# Patient Record
Sex: Female | Born: 1960 | Race: White | Hispanic: No | Marital: Married | Smoking: Former smoker
Health system: Southern US, Community
[De-identification: ages and names within clinical notes are randomized; demographics above are authoritative.]

## PROBLEM LIST (undated history)

## (undated) DIAGNOSIS — T8859XA Other complications of anesthesia, initial encounter: Secondary | ICD-10-CM

## (undated) DIAGNOSIS — T4145XA Adverse effect of unspecified anesthetic, initial encounter: Secondary | ICD-10-CM

## (undated) DIAGNOSIS — M1990 Unspecified osteoarthritis, unspecified site: Secondary | ICD-10-CM

## (undated) DIAGNOSIS — E7800 Pure hypercholesterolemia, unspecified: Secondary | ICD-10-CM

## (undated) DIAGNOSIS — E039 Hypothyroidism, unspecified: Secondary | ICD-10-CM

## (undated) DIAGNOSIS — R9431 Abnormal electrocardiogram [ECG] [EKG]: Secondary | ICD-10-CM

## (undated) DIAGNOSIS — E079 Disorder of thyroid, unspecified: Secondary | ICD-10-CM

## (undated) DIAGNOSIS — Z972 Presence of dental prosthetic device (complete) (partial): Secondary | ICD-10-CM

## (undated) DIAGNOSIS — K219 Gastro-esophageal reflux disease without esophagitis: Secondary | ICD-10-CM

## (undated) DIAGNOSIS — C801 Malignant (primary) neoplasm, unspecified: Secondary | ICD-10-CM

## (undated) HISTORY — PX: BACK SURGERY: SHX140

---
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
## 2004-02-15 ENCOUNTER — Encounter: Payer: Self-pay | Admitting: Unknown Physician Specialty

## 2004-03-17 ENCOUNTER — Encounter: Payer: Self-pay | Admitting: Unknown Physician Specialty

## 2004-04-06 ENCOUNTER — Ambulatory Visit: Payer: Self-pay | Admitting: Otolaryngology

## 2004-04-14 ENCOUNTER — Ambulatory Visit: Payer: Self-pay | Admitting: Unknown Physician Specialty

## 2004-07-22 ENCOUNTER — Emergency Department: Payer: Self-pay | Admitting: Internal Medicine

## 2004-09-28 ENCOUNTER — Encounter: Payer: Self-pay | Admitting: Orthopedic Surgery

## 2005-02-11 ENCOUNTER — Ambulatory Visit: Payer: Self-pay | Admitting: Otolaryngology

## 2005-03-10 ENCOUNTER — Ambulatory Visit: Payer: Self-pay | Admitting: Otolaryngology

## 2005-05-11 ENCOUNTER — Ambulatory Visit: Payer: Self-pay | Admitting: Otolaryngology

## 2005-05-15 ENCOUNTER — Other Ambulatory Visit: Payer: Self-pay

## 2005-05-15 ENCOUNTER — Inpatient Hospital Stay: Payer: Self-pay | Admitting: Unknown Physician Specialty

## 2005-11-04 ENCOUNTER — Encounter: Admission: RE | Admit: 2005-11-04 | Discharge: 2005-11-04 | Payer: Self-pay | Admitting: Orthopaedic Surgery

## 2005-12-08 ENCOUNTER — Inpatient Hospital Stay (HOSPITAL_COMMUNITY): Admission: RE | Admit: 2005-12-08 | Discharge: 2005-12-11 | Payer: Self-pay | Admitting: Orthopaedic Surgery

## 2006-03-23 ENCOUNTER — Encounter: Payer: Self-pay | Admitting: Orthopaedic Surgery

## 2006-04-16 ENCOUNTER — Encounter: Payer: Self-pay | Admitting: Orthopaedic Surgery

## 2006-05-17 ENCOUNTER — Encounter: Payer: Self-pay | Admitting: Orthopaedic Surgery

## 2006-06-28 ENCOUNTER — Ambulatory Visit: Payer: Self-pay | Admitting: Otolaryngology

## 2007-01-21 ENCOUNTER — Encounter: Admission: RE | Admit: 2007-01-21 | Discharge: 2007-01-21 | Payer: Self-pay | Admitting: Anesthesiology

## 2007-03-10 ENCOUNTER — Ambulatory Visit: Payer: Self-pay | Admitting: Otolaryngology

## 2008-03-03 IMAGING — CT CT L SPINE W/ CM
3 of 8 series · 15 of 33 positions shown, 18 images · IV contrast (omnipaque)
Comparison: none

CLINICAL DATA: Back and bilateral lower extremity pain and numbness.  Previous microdiscectomy left L3-4.  Subsequent L3-L5 fusion. 
 LUMBAR MYELOGRAM:
TECHNIQUE: Following informed consent including the risks of pain, infection, bleeding, neurological deficit and headache, sterile preparation of the back and adequate local anesthesia, lumbar puncture was performed by myself at L5-S1 from a left paramedian approach.  Fluid was clear and colorless.  A 22 gauge needle was employed.  I injected 15 cc of Omnipaque 180 into the subarachnoid space.
TECHNIQUE: Multidetector CT imaging of the lumbar spine was performed after intrathecal injection of contrast.  Multiplanar CT image reconstructions were also generated.

[Series 4: recon 3: l-spine helical · axial · 0.27mm/px · z∈[-173,-38]mm · 7 of 145 slices shown, 9 images]
[im 19/145  soft-tissue]
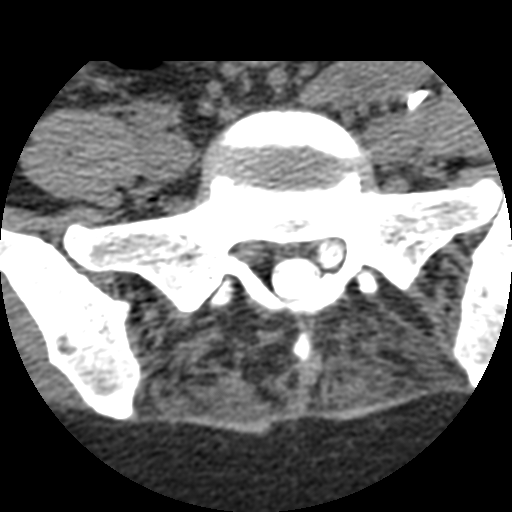
[im 19/145  bone]
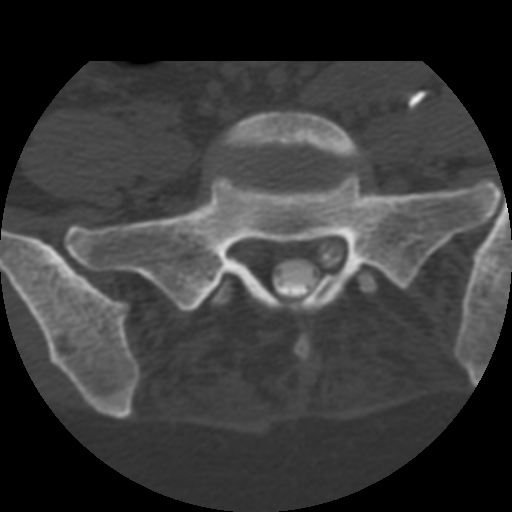
[im 37/145  bone]
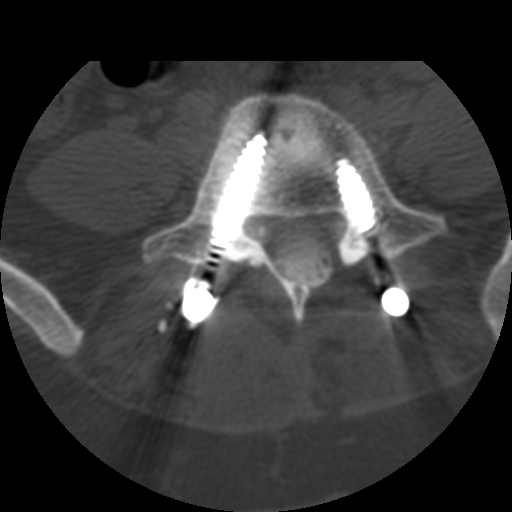
[im 55/145  bone]
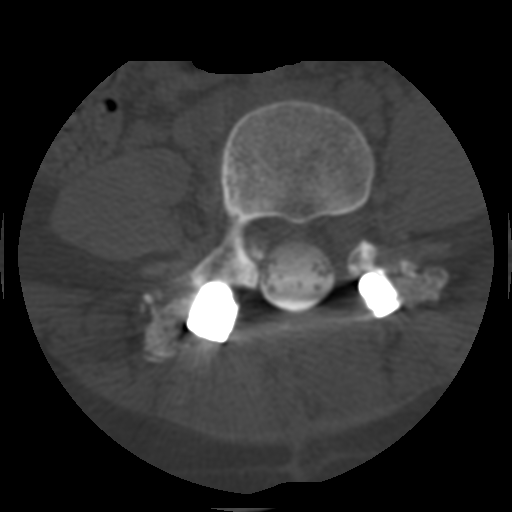
[im 73/145  bone]
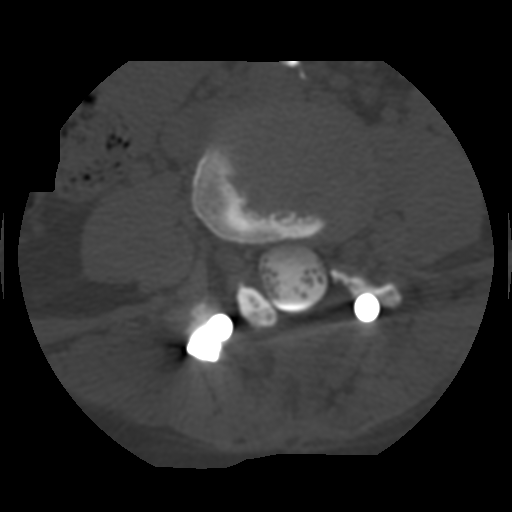
[im 91/145  soft-tissue]
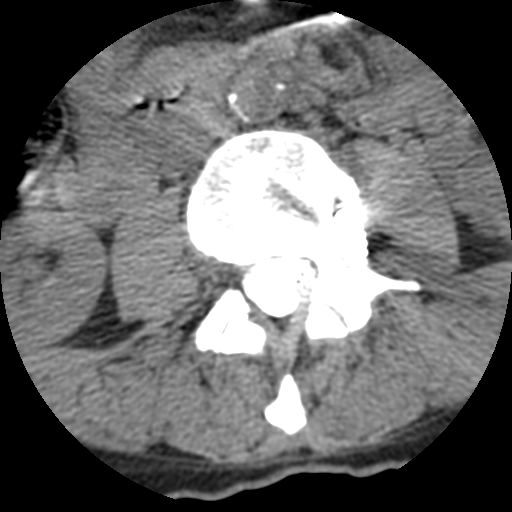
[im 91/145  bone]
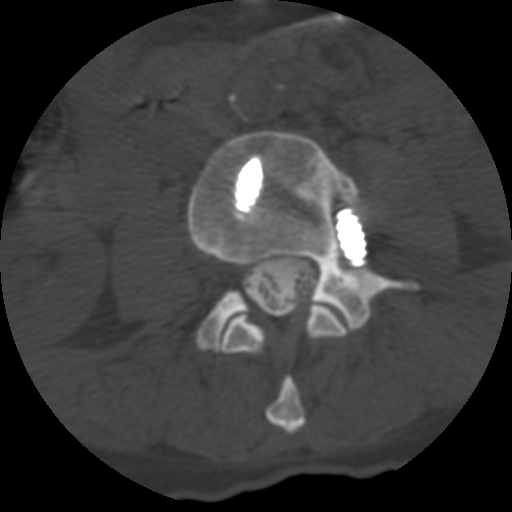
[im 109/145  bone]
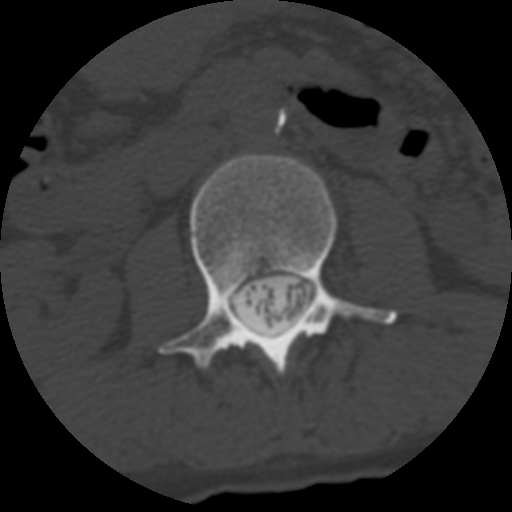
[im 127/145  bone]
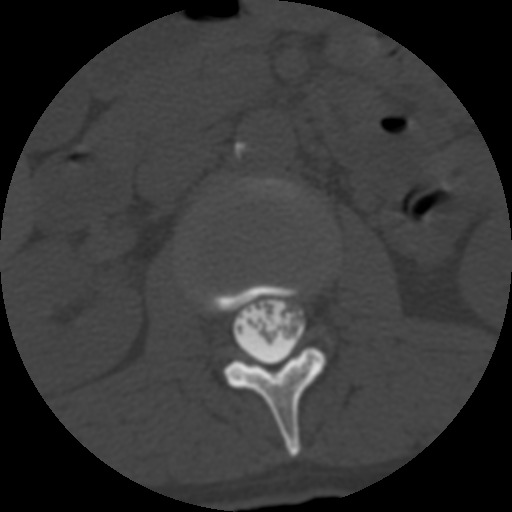

[Series 400: reformatted · sagittal · 0.36mm/px · 5 of 40 slices shown, 6 images (1 of 2)]
[im 14/40  bone]
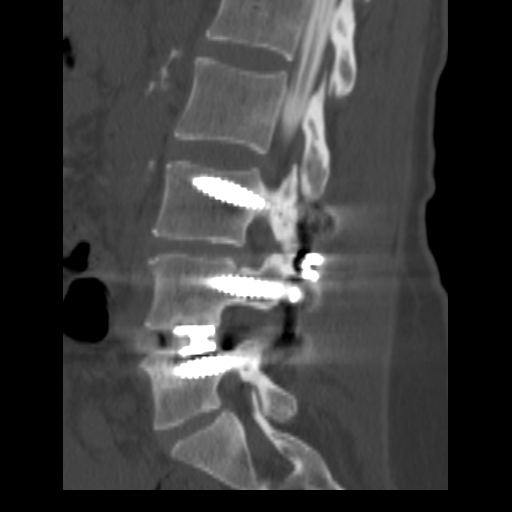
[im 17/40  bone]
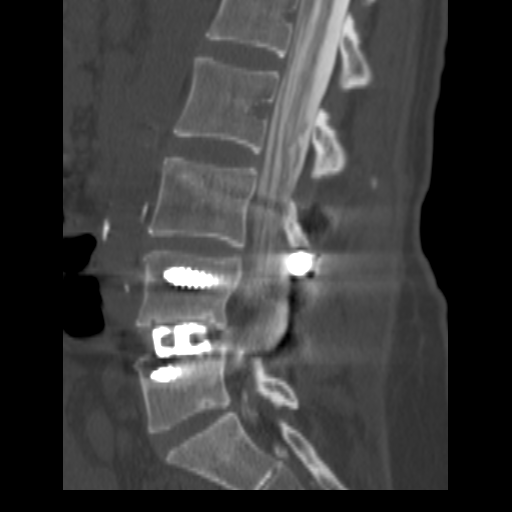
[im 20/40  soft-tissue]
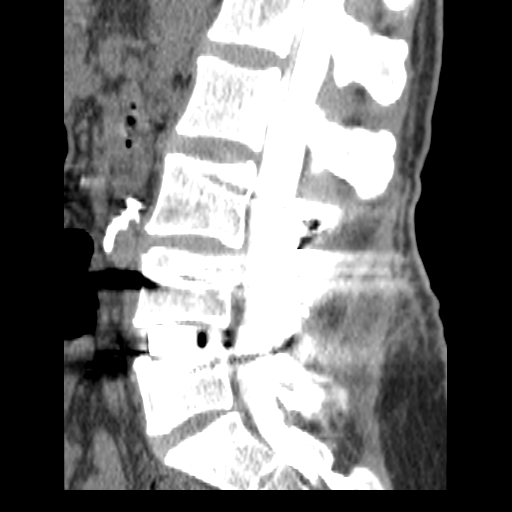
[im 20/40  bone]
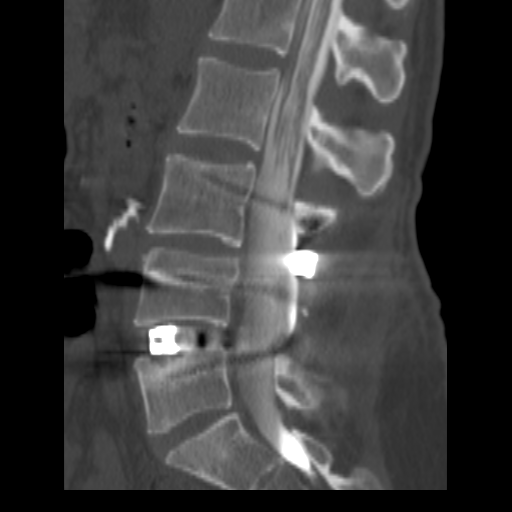
[im 23/40  bone]
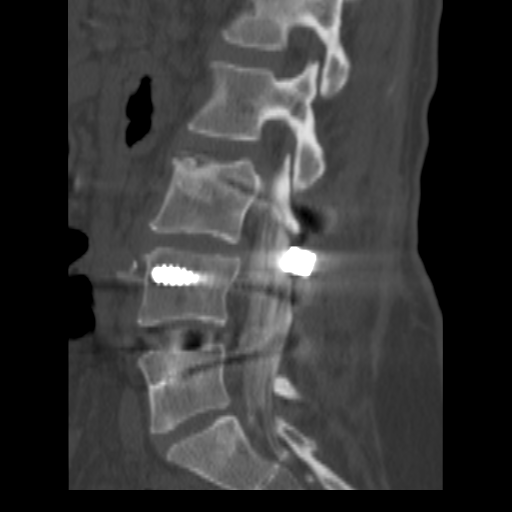
[im 27/40  bone]
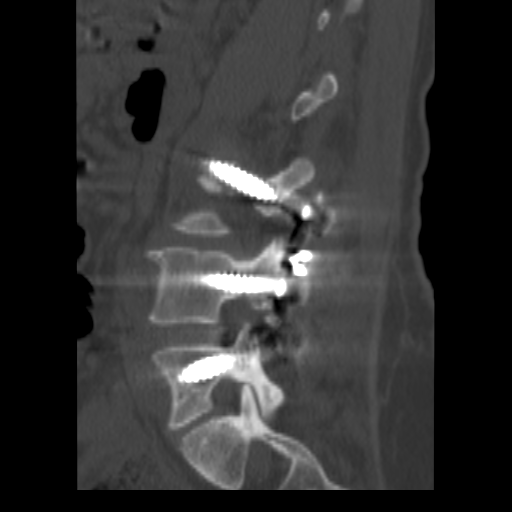

[Series 401: reformatted · coronal · 0.36mm/px · 3 of 40 slices shown (2 of 2)]
[im 8/40  bone]
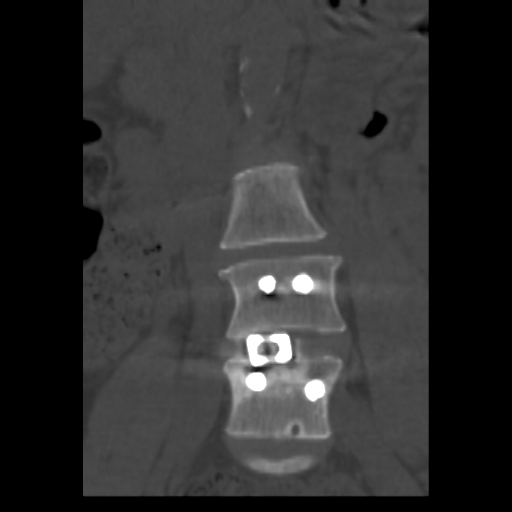
[im 16/40  bone]
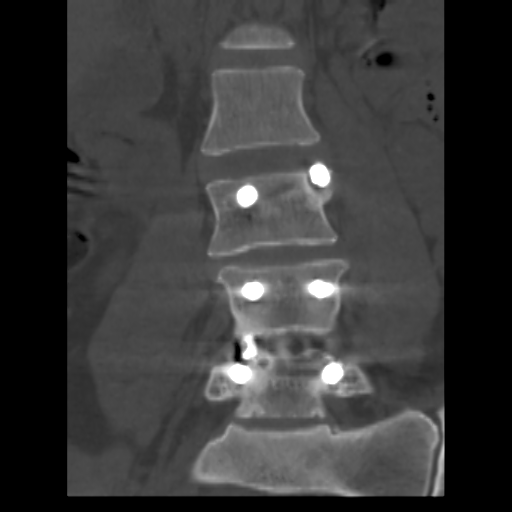
[im 24/40  bone]
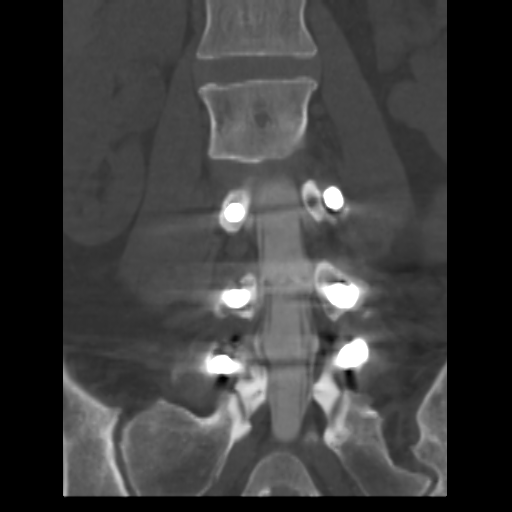

[15 of 33 positions shown; findings below may reference images not displayed]

FINDINGS: AP, lateral and oblique views demonstrate mild truncation of the left ethmoid nerve root.  Hardware from L-3 through L-5 from pedicle screw and rod fixation.  There is a single cage in the L4-5 intervertebral disc space.   There is a 10 to 11 degree curvature convex right measured from T-11 through L-5.  Flexion and extension showed no abnormal movement.  The left L-3 pedicle screw is obviously malpositioned lying lateral to the pedicle.  No significant ventral defects are seen.
IMPRESSION: As above. 
 CT LUMBAR SPINE WITH CONTRAST (POST-MYELOGRAM):
FINDINGS: L1-2:  Normal interspace.
 L2-3:  Normal interspace. 
 L3-4:  No recurrent protrusion.  Previous decompressive laminectomy.  Pedicle screw at L-3 on the right is appropriately positioned.  The pedicle screw on the left never entered the pedicle.  It has been drilled through the center of the transverse process and lies lateral to the vertebral body.  There is some bony reaction to this.  Certainly the left L-2 root is at risk.  There have been bone chips placed posterolaterally over the facet complex but there is no solid bony fusion. 
 L4-5:  A single titanium cage is placed in the right side of the interspace.  There is no solid bony bridging across the L-4 and L-5 vertebral bodies.  Pedicle screws at L-4 and L-5 appear satisfactory.  
 L5-S1:  Hemilaminotomy defect, left.  Residual/recurrent protrusion central and to the left.  Dorsal displacement of the left S-1 nerve root.
IMPRESSION: 1.  Myelographic films demonstrating mild scoliotic deformity convex right without abnormal movement between flexion and extension. 
 2.  Patient is status-post L3-L5 fusion with instrumentation; the left L-3 pedicle screw never entered the pedicle, traverses the transverse process, and lies outside the vertebral body on the left potentially irritating the left L-2 nerve root. 
 3.  No evidence for posterolateral fusion at L3-4 or for interbody fusion at L4-5.
 4.  No recurrent protrusion L3-4, left. 
 5.   Residual/recurrent protrusion L5-S1, left.

## 2008-03-03 IMAGING — CR DG MYELOGRAM LUMBAR
3 series · 3 of 3 positions shown · IV contrast (omnipaque)
Comparison: none

CLINICAL DATA: Back and bilateral lower extremity pain and numbness.  Previous microdiscectomy left L3-4.  Subsequent L3-L5 fusion. 
 LUMBAR MYELOGRAM:
TECHNIQUE: Following informed consent including the risks of pain, infection, bleeding, neurological deficit and headache, sterile preparation of the back and adequate local anesthesia, lumbar puncture was performed by myself at L5-S1 from a left paramedian approach.  Fluid was clear and colorless.  A 22 gauge needle was employed.  I injected 15 cc of Omnipaque 180 into the subarachnoid space.
TECHNIQUE: Multidetector CT imaging of the lumbar spine was performed after intrathecal injection of contrast.  Multiplanar CT image reconstructions were also generated.

[view not recorded (1 of 3)]
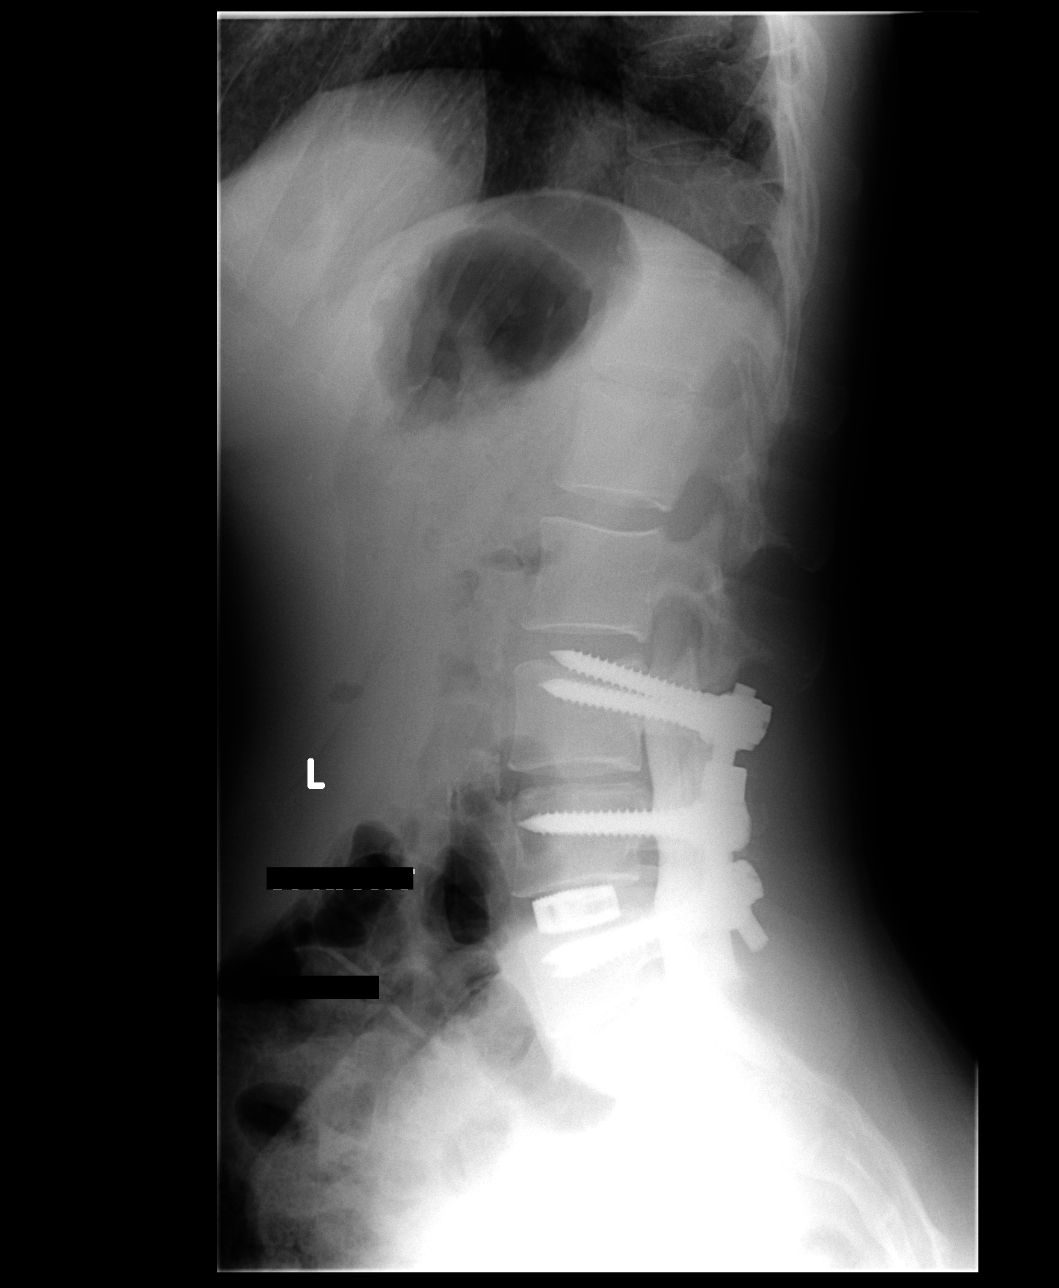

[view not recorded (2 of 3)]
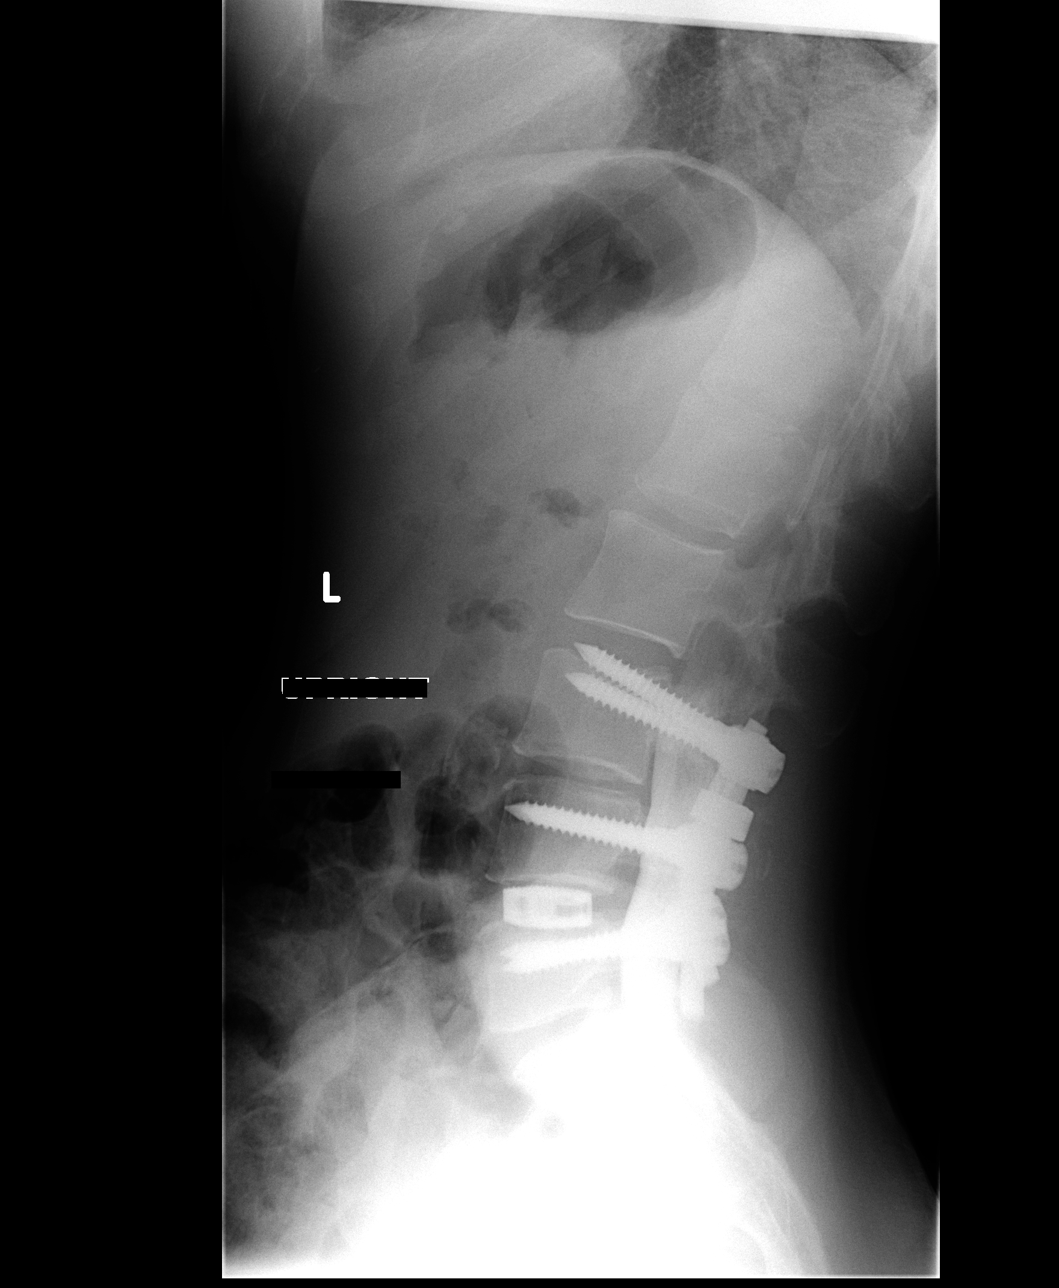

[view not recorded (3 of 3)]
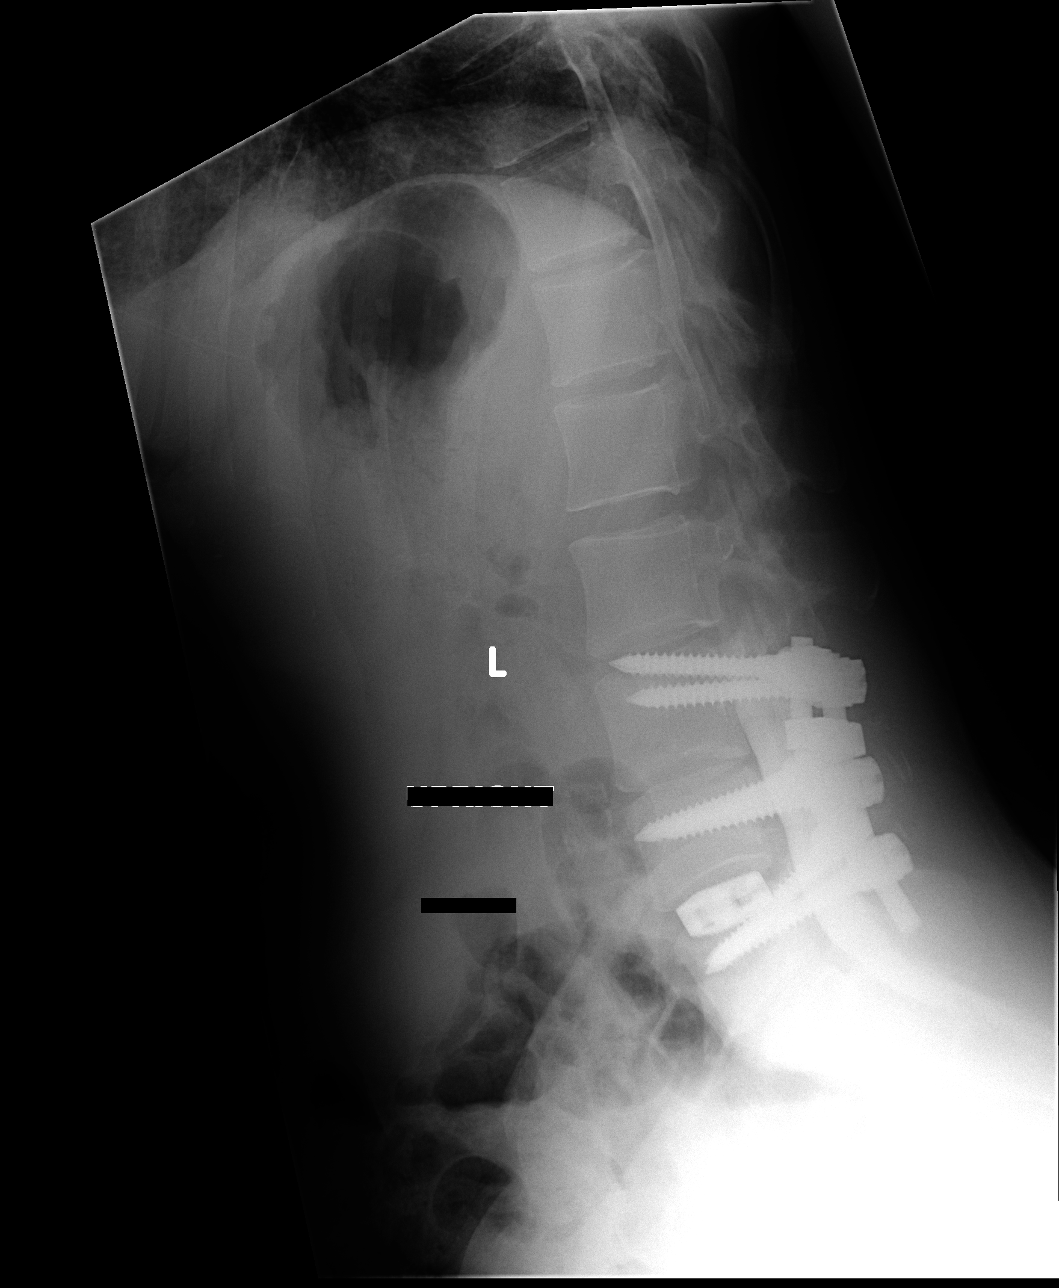

[3 of 3 positions shown; findings below may reference images not displayed]

FINDINGS: AP, lateral and oblique views demonstrate mild truncation of the left ethmoid nerve root.  Hardware from L-3 through L-5 from pedicle screw and rod fixation.  There is a single cage in the L4-5 intervertebral disc space.   There is a 10 to 11 degree curvature convex right measured from T-11 through L-5.  Flexion and extension showed no abnormal movement.  The left L-3 pedicle screw is obviously malpositioned lying lateral to the pedicle.  No significant ventral defects are seen.
IMPRESSION: As above. 
 CT LUMBAR SPINE WITH CONTRAST (POST-MYELOGRAM):
FINDINGS: L1-2:  Normal interspace.
 L2-3:  Normal interspace. 
 L3-4:  No recurrent protrusion.  Previous decompressive laminectomy.  Pedicle screw at L-3 on the right is appropriately positioned.  The pedicle screw on the left never entered the pedicle.  It has been drilled through the center of the transverse process and lies lateral to the vertebral body.  There is some bony reaction to this.  Certainly the left L-2 root is at risk.  There have been bone chips placed posterolaterally over the facet complex but there is no solid bony fusion. 
 L4-5:  A single titanium cage is placed in the right side of the interspace.  There is no solid bony bridging across the L-4 and L-5 vertebral bodies.  Pedicle screws at L-4 and L-5 appear satisfactory.  
 L5-S1:  Hemilaminotomy defect, left.  Residual/recurrent protrusion central and to the left.  Dorsal displacement of the left S-1 nerve root.
IMPRESSION: 1.  Myelographic films demonstrating mild scoliotic deformity convex right without abnormal movement between flexion and extension. 
 2.  Patient is status-post L3-L5 fusion with instrumentation; the left L-3 pedicle screw never entered the pedicle, traverses the transverse process, and lies outside the vertebral body on the left potentially irritating the left L-2 nerve root. 
 3.  No evidence for posterolateral fusion at L3-4 or for interbody fusion at L4-5.
 4.  No recurrent protrusion L3-4, left. 
 5.   Residual/recurrent protrusion L5-S1, left.

## 2009-05-19 ENCOUNTER — Ambulatory Visit: Payer: Self-pay | Admitting: Otolaryngology

## 2009-07-23 ENCOUNTER — Ambulatory Visit: Payer: Self-pay | Admitting: Unknown Physician Specialty

## 2010-06-07 ENCOUNTER — Encounter: Payer: Self-pay | Admitting: Neurosurgery

## 2010-09-17 ENCOUNTER — Ambulatory Visit: Payer: Self-pay | Admitting: Unknown Physician Specialty

## 2010-10-02 NOTE — Discharge Summary (Signed)
Tonya Rush, Tonya Rush                ACCOUNT NO.:  [id number]   MEDICAL RECORD NO.:  [id number]          PATIENT TYPE:  INP   LOCATION:  5040                         FACILITY:  [facility]   PHYSICIAN:  Verlin Fester, P.A.    DATE OF BIRTH:  1961-05-10   DATE OF ADMISSION:  12/08/2005  DATE OF DISCHARGE:  12/11/2005                                 DISCHARGE SUMMARY   ADMITTING DIAGNOSES:  1. Pseudoarthrosis L3-L5.  2. Degenerative disk disease L5-S1.  3. Anxiety.  4. Gastroesophageal reflux disease.  5. Chronic hyponatremia.   DISCHARGE DIAGNOSES:  1. Status post revision of posterior spinal fusion L3 to S1.  2. Postoperative blood loss anemia.  3. Postoperative hyperkalemia.  4. Postoperative hypokalemia.   PROCEDURES:  On December 08, 2005, the patient was taken to the operating room  for revision of posterior spinal fusion L3 to S1, TLIF L3-L4 and L5-S1, BMP  and graft.   SURGEON:  Sharolyn Douglas, M.D.   ASSISTANT:  Jill Side Mahar, PA-C.   ANESTHESIA:  General.   CONSULT:  None.   LABS:  CBC with diff was normal preoperatively, with exception of absolute  neutrophils of 7.9.  H&H measured postoperatively reached a low of 10.4 and  29.5 on December 11, 2005.  PT-INR and PTT are preoperatively normal with the  exception of PTT slightly elevated at 38.  Complete metabolic panel from  preop was normal with the exception of sodium 127, chloride of 94.  Postoperatively basic metabolic panel was monitored x3 days.  On  postoperative day one sodium was 131, potassium was 5.3, glucose was 104.  Postoperative day two sodium was normal, potassium 3.3, glucose 124, BUN of  2.  On postoperative day three, potassium had returned to normal, glucose  remained elevated at 129, BUN of 3, and calcium 8.2.  Otherwise all values  were normal.  TSH on December 03, 2005, was 0.572.  Antibody was 147.4% with  less than 0.2.  Thyroglobulin was less than 0.2.  UA from December 03, 2005, was  negative.  UA from December 11, 2005, showed trace hemoglobin and a few  epithelials.  Blood typing from December 03, 2005, and December 08, 2005, was O-  negative.  Antibody screen was negative.  Urine culture on December 03, 2005,  showed no growth.   X-RAY:  From December 11, 2005, a lumbar spine showed L3 to S1 fusion with no  complicating features.  Did appear left interventricular screw at L3 had  been repositioned since the prior CT on December 04, 2005.   BRIEF HISTORY:  The patient is a 50 year old female who has previously  undergone lumbar surgeries in 2005 as well as 2006.  Unfortunately she had  been left with chronic pain that was debilitating.  She had a  pseudoarthrosis at L3-L5.  She has been having increasing pain which has  been getting progressively worse.  She failed to improve with any  conservative treatment therefore it was thought our best course of  management would be a revision decompression fusion L3 down to the sacral  area.  Risks and benefits of the procedure were discussed with the patient  by Dr. Noel Gerold as well as myself.  She indicated understanding and opted to  proceed.   HOSPITAL COURSE:  On December 08, 2005, the patient was taken to the operating  room for the above-listed procedure.  She tolerated the procedure well  without any intraoperative complications, and she was transferred to  recovery in stable condition.   Postoperatively routine orthopedic spine protocol was followed.  The patient  progressed along relatively well.  Pain control initially was an issue  secondary to her narcotic use preoperatively.  We resumed her OxyContin and  Percocet.  I will put her on Morphine PCA and we used a Marcaine  intramuscular pump on cue.  Pain control improved over postoperative day  three.   Physical therapy and occupational therapy worked with the patient on a daily  basis.  She got to the point that she was independent and stable with her  back precautions, brace use, and ambulation prior to  discharge.   By December 11, 2005, the patient was medically stable and ready for discharge  and orthopedically stable and had met all goals.   DISCHARGE PLAN:  The patient is a 50 year old female status post revision  posterior spinal fusion and decompression L3 to S1 and doing well.   ACTIVITIES:  Daily ambulation program.  Brace on when she is up.  No lifting  heavier than five pounds.  Back precautions at all times.  Daily dressing  change.  Keep incision dry until all drainage stops.  Follow up in two weeks  postoperatively with Dr. Noel Gerold.   DIET:  Regular diet as tolerated.   MEDICATIONS:  Dilaudid p.o. for pain, calcium daily, multivitamin daily,  Colace 100 mg twice daily.  Avoid NSAIDs.  Use of laxative as needed.  Avoid  smoking.   CONDITION ON DISCHARGE:  Stable, improved.   DISPOSITION:  The patient is being discharged to her home with family  assistance as well as home physical therapy and occupational therapy with  advancement.      Verlin Fester, P.A.     CM/MEDQ  D:  01/27/2006  T:  01/27/2006  Job:  [id number]   cc:   Sharolyn Douglas, M.D.

## 2010-10-02 NOTE — Op Note (Signed)
NAMEARITZEL, KRUSEMARK                ACCOUNT NO.:  [id number]   MEDICAL RECORD NO.:  [id number]          PATIENT TYPE:  INP   LOCATION:  5040                         FACILITY:  [facility]   PHYSICIAN:  Sharolyn Douglas, M.D.        DATE OF BIRTH:  1960-05-23   DATE OF PROCEDURE:  12/08/2005  DATE OF DISCHARGE:                                 OPERATIVE REPORT   DIAGNOSIS:  1. Lumbar pseudoarthrosis L4-L5 and L5-S1.  2. Malpositioned pedicle screw left L3.  3. Recurrent herniated nucleus pulposus L5-S1 left.  4. Lumbar spinal stenosis.  5. Post laminectomy syndrome.   PROCEDURE:  1. Removal of segmental pedicle screw instrumentation L3 through L5 with      exploration of the spinal fusion.  2. Revision lumbar laminectomy L3-L4, L4-L5 and L5-S1.  3. Transforaminal lumbar interbody fusion L3-L4, L4-L5 and L5-S1, with      placement of the PEEK cages at L3-L4 and L5-S1.  4. Segmental pedicle screw instrumentation L3 through S1 using the Abbott      spine system.  5. Posterior spinal fusion L3 through S1.  6. Local autogenous bone graft supplemented with bone morphogenic protein      and 15 mL Grafton allograft.   SURGEON:  Sharolyn Douglas, M.D.   ASSISTANTJill Side Mahar, P.A.-C.   ANESTHESIA:  General endotracheal.   ESTIMATED BLOOD LOSS:  300 mL   COMPLICATIONS:  None.   COUNTS:  Needle and sponge count correct.   INDICATIONS:  The patient is a pleasant 50 year old female with chronic  disabling pain in her back and bilateral lower extremities.  She has had a  previous lumbar fusion from L3 to L5 by Dr. Elvina Sidle and, also, a hemilaminotomy  on the left at L5-S1 by Dr. Cranford Mon.  Her radiographs and CT myelogram show  possible pseudoarthrosis at the fused levels at L3-4 and L4-5 with a  malpositioned pedicle screw on the left at L3.  There is a recurrent disk  herniation on the left at L5-S1 with dorsal displacement of the left S1  nerve root.  She now presents for exploration of the fusion with  possible  revision instrumentation and decompression.  The risks and benefits were  extensively reviewed and she has elected to proceed.   PROCEDURE:  She was identified in the holding area.  After informed consent,  she was taken to the operating room.  She underwent general endotracheal  anesthesia without difficulty.  She was given prophylactic IV antibiotics.  Neural monitoring was established in the form of lower extremity SSEPs and  EMGs.  She was carefully turned prone onto the Wilson frame.  All bony  prominences were padded.  The face and eyes were protected at all times.  Her back was prepped and draped in the usual sterile fashion.  The previous  incision was utilized.  It was curved off to the left side inferiorly and we  chose to extend the incision in the midline instead of following the  previous scar.  Dissection was carried through the previous scar and deep  fascia.  We identified the residual spinous processes of L2 and L5.  She had  had a previous laminectomy at L3-L4 and L4-L5 and great care was taken not  to inadvertently enter the spinal canal during the exposure.  The Synthes  USS instrumentation was exposed from L3 to L5.  The exposure was carried  down to the sacrum exposing the ala.  We then used the appropriate  screwdrivers and wrenches to remove the Synthes rods and pedicle screws.  We  found that the screws were well fixed at L5 and L4.  The left L3 screw  simply pulled out and was simply penetrating the transverse process.  The L3  pedicle screw on the right side had windshield wipered and enlarged its hole  and, also, could simply be pulled out with a Kocher clamp.  At this point,  the Bovie was used to further explore the arthrodesis.  There were loose  pieces of bone graft but no evidence of bridging bone.  Exploration of the  facette joints between L3-L4 and L4-L5 showed gross motion.  It was clear,  at this point, that she had not healed the fusion.   The dissection was  continued exposing the remnants of the transverse processes at L3, L4, L5,  and also the sacral ala.  The previous fusion and along with the transverse  processes was decorticated.   We then turned our attention to performing a revision laminectomy.  Using  the Leksell rongeur, the scar tissue was debrided.  Curettes were used to  define the edges of the previous laminectomy.  We then used the high-speed  bur along with Kerrison punches to carefully remove the residual lamina.  The decompression was widened out flush with the pedicles.  We identified  the L4-L5 nerve roots bilaterally and confirmed that they were free.  We  then carried the decompression down across the L5-S1 segment.  She had a  previous hemilaminotomy on the left and this, again, was dissected and the  lamina removed with the Kerrison punches.  The S1 nerve root on the left was  being displaced by a large subligamentous disk herniation.  The S1 nerve  root was carefully dissected free of scar tissue and then could be mobilized  over the disk herniation.  The disk was entered and a large fragment was  removed under pressure.  Immediately, the S1 nerve root became more mobile.   At this time, we turned our attention to re-instrumenting the spine.  Starting on the left at L3 where the pedicle screw had been malpositioned, a  new starting hole was initiated.  The pedicle could be palpated from within  the spinal canal after the laminectomy.  The screw was medialized and  started with the pedicle probe.  The hole was tapped.  The hole was palpated  with a ball-tip feeler and there were not any breeches.  We then placed a  5.5 x 45 mm screw and had excellent purchase.  On the right side at L3, we  used a 7.5 x 45 mm screw to fill the previous hole.  We had an excellent  purchase.  At L4, we utilized 6.5 x 40 mm screws with good purchase. At L5, 6.5 x 40 mm screws were utilized.  At S1, 7.5 x 35 mm screws  were placed.  Each screw was then stimulated using triggered EMGs and there were no  deleterious changes.   Because the patient had failed to heal her previous fusion,  it was elected  to proceed with transforaminal lumbar interbody fusions in order to enhance  the fusion rate.  Starting at L3-L4, the residual facette joint was removed  on the right side.  The exiting and transversing nerve roots were identified  and free running EMGs were monitored. The disk space was entered.  A radical  diskectomy was carried out across the contralateral side.  The cartilaginous  endplates were scraped clean.  The disk space was dilated up to 9 mm.  The  disk space was packed with BMP sponges from the large Infuse kit along with  local bone graft from the laminectomy.  We then inserted a 9-mm PEEK cage  packed with a BMP sponge into the interspace, carefully tapped it anteriorly  across the midline.  We then moved down to the L4-L5 level.  She had a  previous TLIF on the right side and, therefore, we elected to enter the disk  space from the left.  The remaining facette joint was again osteotomized.  The exiting transversing nerve roots were identified and protected.  The  disk space was entered. The disk space was dilated up to 9 mm.  We could  identify the titanium cage which was placed from her previous surgery.  It  was loose.  Several attempts at removing this cage were made, but it was  felt that potential trauma to the nerve structures could arise if further  attempts were made. Therefore, we elected to leave the cage in place for  structural support.  The end plates were decorticated.  The disk space was  then packed with a BMP sponge along with local bone graft obtained from the  laminectomy.  We placed several large pieces of bone that were tapped into  place into the interspace.  We then moved down to the L5-S1 segment.  The  facette joint on the left at L5-S1 was osteotomized.  Again, the   transversing exiting nerve roots were identified and protected, free running  EMGs were monitored.  The cartilaginous endplates were space scraped clean.  Radical diskectomy was completed.  Disk space dilated up to 9 mm, disk space  packed with BMP sponges along with local bone graft.  A 9-mm PEEK cage was  packed with a BMP sponge inserted into the interspace, tapped anteriorly and  across the midline.   At this point, we completed the posterior spinal arthrodesis by packing the  remaining local bone graft along with 15 mL of Grafton allograft into the  lateral gutters which had been decorticated from L3 down to the sacrum.  Short segment titanium rods were cut to length and bent into lordosis.  The  rods were placed into the polyaxial screw heads and the locking caps were  applied.  Compression was applied across each segment before shearing off  the caps.  A cross connector was placed.  There was an area over the dura on the left side at L3-L4 where the previous cross connector had been sitting.  This area was extremely thin.  There was no active CSF leak but we chose to  reinforce this with a small piece of Duragen and Tisseel fibrin glue.  Hemostasis was achieved.   We placed On-Q catheters into the paraspinal muscle bilaterally using the  catheters provided with the kit.  Each catheter was bolused with 5 mL of  0.5% Marcaine.  We then closed the deep fascia with a running #1 Vicryl  suture.  The subcutaneous layer  was closed with 0 Vicryl and 2-0 Vicryl  followed by a running 3-0 subcuticular Vicryl suture on the skin edges.  Benzoin and Steri-Strips were placed.  A sterile dressing was applied.  The  patient was turned supine, extubated without difficulty, and transferred to  the recovery room in stable condition, able to move her upper and lower  extremities.  It should be noted that my assistant, Verlin Fester, P.A.-C.,  was present throughout the procedure including during  positioning, during  the exposure, during the decompression, the instrumentation, and the fusion.  She also assisted with the wound closure.      Sharolyn Douglas, M.D.  Electronically Signed     MC/MEDQ  D:  12/08/2005  T:  12/08/2005  Job:  [id number]

## 2010-10-02 NOTE — H&P (Signed)
Tonya Rush, Tonya Rush                ACCOUNT NO.:  [id number]   MEDICAL RECORD NO.:  [id number]          PATIENT TYPE:  INP   LOCATION:  NA                           FACILITY:  [facility]   PHYSICIAN:  Sharolyn Douglas, M.D.        DATE OF BIRTH:  August 03, 1960   DATE OF ADMISSION:  12/08/2005  DATE OF DISCHARGE:                                HISTORY & PHYSICAL   CHIEF COMPLAINT:  Low back and bilateral lower extremity pain, left greater  than right.   HISTORY OF PRESENT ILLNESS:  The patient is a 50 year old female who has  undergone a previous posterior spinal fusion.  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  She has been having increasing pain which is  getting progressively worse and more debilitating to her.  Secondary to  these findings as well as her continued pain and failure to improve with  conservative management, it was felt her best course of management would be  revision posterior spinal fusion L3 to S1.  Risks and benefits of the  surgery were discussed with the patient by Dr. Noel Gerold as well as myself.  She  indicated understanding and opted to proceed.   ALLERGIES:  1.  PENICILLIN causes breathing problems.   Other intolerances include:  1.  CLINDAMYCIN.  2.  MOBIC.  3.  NABUMETONE.  4.  NAPROSYN.  5.  DICLOFENAC.  6.  SKELAXIN.  7.  NEURONTIN.  8.  SONATA.  9.  GABITRIL.  10. CYMBALTA.  11. LUNESTA.  12. TOFRANIL.  13. AMBIEN.  14. AMITRIPTYLINE.  15. EFFEXOR.  16. NEXIUM.  17. PRILOSEC.  18. PREVACID.  19. ZANTAC.  20. VYTORIN.  21. LIPITOR.  22. CRESTOR.   MEDICATIONS:  Synthroid, ibuprofen, lorazepam, OxyContin, lidocaine patch,  hydrocodone, Risperdal, pravastatin sodium, progesterone cream, estradiol,  calcium, multivitamin, laxative and stool softener as needed.   PAST MEDICAL HISTORY:  1.  Anxiety.  2.  History of pneumonia.  3.  Reflux disease.  4.  History of thyroid cancer.  5.  Chronic hyponatremia, etiology unknown.   PAST SURGICAL  HISTORY:  1.  Knee surgery 2002.  2.  Microdiskectomy 2005.  3.  Lumbar fusion 2006.  4.  Total thyroidectomy 2006.   SOCIAL HISTORY:  The patient smokes one pack of cigarettes a day.  Denies  alcohol use.  She is married.  Her husband will be available to help her  through her postoperative course.   FAMILY MEDICAL HISTORY:  Noncontributory.   REVIEW OF SYSTEMS:  The patient denies fevers, chills, sweats or bleeding  tendency.  CNS:  Denies blurred vision or double vision, seizures, headache.  CARDIOVASCULAR:  Denies chest pain, angina, claudication or palpitations.  PULMONARY:  Denies shortness of breath, productive cough or hemoptysis.  GI:  Denies nausea, vomiting, constipation, diabetes, melena or bloody stool.  GU:  Denies dysuria, hematuria, discharge.  MUSCULOSKELETAL:  As per HPI.   PHYSICAL EXAMINATION:  VITAL SIGNS:  Pulse is 100 and regular, respirations  20 and regular unlabored, blood pressure 100/60.  GENERAL APPEARANCE:  The  patient is a 50 year old white female who is alert  and oriented in no acute distress.  She is well nourished, well groomed,  appears stated age, pleasant and cooperative to the exam.  HEENT:  Head is normocephalic, atraumatic.  Pupils were equal, round and  reactive to light.  Extraocular movements intact.  Nares patent.  Pharynx  clear.  NECK:  Supple to palpation.  No lymphadenopathy or thyromegaly noted.  No  bruit is appreciated.  CHEST:  Clear to auscultation bilaterally.  No rales, rhonchi, wheezes or  friction rub.  BREASTS:  Not pertinent and not performed.  CARDIOVASCULAR:  S1, S2 regular rate and rhythm.  No murmurs, rubs or  gallops noted.  ABDOMEN:  Soft to palpation.  Nontender.  Nondistended.  No organomegaly  noted.  Positive bowel sounds throughout.  GU:  Not pertinent and not performed.  EXTREMITIES:  As per HPI.  SKIN:  Intact without lesions or rashes.   RADIOLOGIC DATA:  X-ray and CT show pseudoarthrosis L3 to L5.    IMPRESSION:  1.  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  2.  Anxiety.  3.  Gastroesophageal reflux disease.  4.  Chronic hyponatremia, etiology unknown.   PLAN:  1.  Admit to [facility] on 12/08/2005 for revision posterior spinal      fusion L3 to S1.  Surgery will be Sharolyn Douglas, M.D.  2.  The patient's primary care doctor is Dr. Arlana Pouch.  He has given her      preoperative clearance to proceed with surgery.  3.  The patient will need Protonix daily through her postoperative course.  4.  Will resume the patient's OxyContin and also use Percocet for pain      postoperatively along with a PCA.      Verlin Fester, P.A.      Sharolyn Douglas, M.D.  Electronically Signed    CM/MEDQ  D:  12/01/2005  T:  12/01/2005  Job:  [id number]   cc:   Sharolyn Douglas, M.D.  Fax: [phone]

## 2010-12-17 ENCOUNTER — Ambulatory Visit: Payer: Self-pay | Admitting: Gastroenterology

## 2012-02-16 ENCOUNTER — Ambulatory Visit: Payer: Self-pay

## 2014-06-04 ENCOUNTER — Ambulatory Visit: Payer: Self-pay | Admitting: Anesthesiology

## 2014-11-25 ENCOUNTER — Ambulatory Visit: Payer: Self-pay | Admitting: Family Medicine

## 2016-01-30 ENCOUNTER — Emergency Department: Payer: Medicare Other

## 2016-01-30 ENCOUNTER — Encounter: Payer: Self-pay | Admitting: Emergency Medicine

## 2016-01-30 ENCOUNTER — Emergency Department
Admission: EM | Admit: 2016-01-30 | Discharge: 2016-01-30 | Disposition: A | Discharge status: Home / Self Care | Payer: Medicare Other | Attending: Emergency Medicine | Admitting: Emergency Medicine

## 2016-01-30 DIAGNOSIS — R102 Pelvic and perineal pain: Secondary | ICD-10-CM | POA: Diagnosis present

## 2016-01-30 DIAGNOSIS — Z79899 Other long term (current) drug therapy: Secondary | ICD-10-CM | POA: Insufficient documentation

## 2016-01-30 DIAGNOSIS — N824 Other female intestinal-genital tract fistulae: Secondary | ICD-10-CM | POA: Insufficient documentation

## 2016-01-30 DIAGNOSIS — F17200 Nicotine dependence, unspecified, uncomplicated: Secondary | ICD-10-CM | POA: Insufficient documentation

## 2016-01-30 DIAGNOSIS — K529 Noninfective gastroenteritis and colitis, unspecified: Secondary | ICD-10-CM | POA: Diagnosis not present

## 2016-01-30 DIAGNOSIS — Z859 Personal history of malignant neoplasm, unspecified: Secondary | ICD-10-CM | POA: Diagnosis not present

## 2016-01-30 HISTORY — DX: Malignant (primary) neoplasm, unspecified: C80.1

## 2016-01-30 HISTORY — DX: Disorder of thyroid, unspecified: E07.9

## 2016-01-30 LAB — URINALYSIS COMPLETE WITH MICROSCOPIC (ARMC ONLY)
BILIRUBIN URINE: NEGATIVE
Bacteria, UA: NONE SEEN
Glucose, UA: NEGATIVE mg/dL
KETONES UR: NEGATIVE mg/dL
Leukocytes, UA: NEGATIVE
NITRITE: NEGATIVE
PH: 8 (ref 5.0–8.0)
PROTEIN: NEGATIVE mg/dL
SPECIFIC GRAVITY, URINE: 1.001 — AB (ref 1.005–1.030)
Squamous Epithelial / LPF: NONE SEEN

## 2016-01-30 LAB — COMPREHENSIVE METABOLIC PANEL
ALBUMIN: 3.9 g/dL (ref 3.5–5.0)
ALK PHOS: 76 U/L (ref 38–126)
ALT: 11 U/L — AB (ref 14–54)
ANION GAP: 9 (ref 5–15)
AST: 16 U/L (ref 15–41)
BILIRUBIN TOTAL: 0.3 mg/dL (ref 0.3–1.2)
CALCIUM: 9 mg/dL (ref 8.9–10.3)
CO2: 28 mmol/L (ref 22–32)
CREATININE: 0.56 mg/dL (ref 0.44–1.00)
Chloride: 89 mmol/L — ABNORMAL LOW (ref 101–111)
GFR calc Af Amer: >60 mL/min (ref >60)
GFR calc non Af Amer: >60 mL/min (ref >60)
GLUCOSE: 118 mg/dL — AB (ref 65–99)
Potassium: 3.4 mmol/L — ABNORMAL LOW (ref 3.5–5.1)
Sodium: 126 mmol/L — ABNORMAL LOW (ref 135–145)
TOTAL PROTEIN: 7.8 g/dL (ref 6.5–8.1)

## 2016-01-30 LAB — CBC
HCT: 38.9 % (ref 35.0–47.0)
HEMOGLOBIN: 13.6 g/dL (ref 12.0–16.0)
MCH: 31.4 pg (ref 26.0–34.0)
MCHC: 35.1 g/dL (ref 32.0–36.0)
MCV: 89.5 fL (ref 80.0–100.0)
PLATELETS: 324 10*3/uL (ref 150–440)
RBC: 4.34 MIL/uL (ref 3.80–5.20)
RDW: 13.2 % (ref 11.5–14.5)
WBC: 8.7 10*3/uL (ref 3.6–11.0)

## 2016-01-30 LAB — LACTIC ACID, PLASMA: Lactic Acid, Venous: 0.8 mmol/L (ref 0.5–1.9)

## 2016-01-30 MED ORDER — IOPAMIDOL (ISOVUE-300) INJECTION 61%
100.0000 mL | Freq: Once | INTRAVENOUS | Status: AC | PRN
  Administered 2016-01-30: 100 mL via INTRAVENOUS

## 2016-01-30 MED ORDER — SODIUM CHLORIDE 0.9 % IV BOLUS (SEPSIS): 30.0000 mL/kg | Freq: Once | INTRAVENOUS | Status: DC

## 2016-01-30 MED ORDER — CIPROFLOXACIN HCL 500 MG PO TABS: 500.0000 mg | ORAL_TABLET | Freq: Two times a day (BID) | ORAL | 0 refills | Status: AC

## 2016-01-30 MED ORDER — ONDANSETRON HCL 4 MG/2ML IJ SOLN
INTRAMUSCULAR | Status: AC
  Administered 2016-01-30: 4 mg
  Filled 2016-01-30: qty 2

## 2016-01-30 MED ORDER — IOPAMIDOL (ISOVUE-300) INJECTION 61%
30.0000 mL | Freq: Once | INTRAVENOUS | Status: AC | PRN
  Administered 2016-01-30: 30 mL via ORAL

## 2016-01-30 MED ORDER — METRONIDAZOLE 500 MG PO TABS: 500.0000 mg | ORAL_TABLET | Freq: Three times a day (TID) | ORAL | 0 refills | Status: AC

## 2016-01-30 MED ORDER — ONDANSETRON HCL 4 MG/2ML IJ SOLN
4.0000 mg | Freq: Once | INTRAMUSCULAR | Status: AC
  Administered 2016-01-30: 4 mg via INTRAVENOUS
  Filled 2016-01-30: qty 2

## 2016-01-30 MED ORDER — HYDROMORPHONE HCL 1 MG/ML IJ SOLN
1.0000 mg | Freq: Once | INTRAMUSCULAR | Status: AC
  Administered 2016-01-30: 1 mg via INTRAVENOUS
  Filled 2016-01-30: qty 1

## 2016-01-30 MED ORDER — SODIUM CHLORIDE 0.9 % IV BOLUS (SEPSIS)
1000.0000 mL | Freq: Once | INTRAVENOUS | Status: AC
  Administered 2016-01-30: 1000 mL via INTRAVENOUS

## 2016-01-30 NOTE — ED Provider Notes (Addendum)
[facility] Emergency Department Provider Note  ____________________________________________   I have reviewed the triage vital signs and the nursing notes.   HISTORY  Chief Complaint Abdominal Pain    HPI Tonya Rush is a 55 y.o. female who presents today complaining of pelvic pain and fever. 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 Patient did not like the sound of "exploratory surgery" so she did not follow-up. However she has a history of chronic constipations on chronic narcotics for chronic back pain, and over the last week has had increased constipation and now is passing much more stool out of her vagina than she has ever passed before. Patient states that she is also a fever up to 101 2 days ago. She denies dysuria or urinary frequency. She has lower abdominal discomfort. She denies any chest pain shortness of breath she is not vomiting. She is however passing what she believes to be feculent material from her vagina.      Past Medical History:  Diagnosis Date  . Thyroid disease     There are no active problems to display for this patient.   Past Surgical History:  Procedure Laterality Date  . BACK SURGERY      Prior to Admission medications   Not on File    Allergies Penicillins  No family history on file.  Social History Social History  Substance Use Topics  . Smoking status: Current Some Day Smoker  . Smokeless tobacco: Never Used  . Alcohol use No    Review of Systems Constitutional: Positive fever Eyes: No visual changes. ENT: No sore throat. No stiff neck no neck pain Cardiovascular: Denies chest pain. Respiratory: Denies shortness of breath. Gastrointestinal:   no vomiting.  No diarrhea. See history of present illness. Genitourinary: Negative for dysuria. Musculoskeletal: Negative lower extremity swelling Skin: Negative for  rash. Neurological: Negative for severe headaches, focal weakness or numbness. 10-point ROS otherwise negative.  ____________________________________________   PHYSICAL EXAM:  VITAL SIGNS: ED Triage Vitals  Enc Vitals Group     BP 01/30/16 1138 126/88     Pulse Rate 01/30/16 1138 (!) 111     Resp 01/30/16 1138 20     Temp 01/30/16 1138 98.4 F (36.9 C)     Temp Source 01/30/16 1138 Oral     SpO2 01/30/16 1138 98 %     Weight 01/30/16 1139 130 lb (59 kg)     Height 01/30/16 1139 5\' 5"  (1.651 m)     Head Circumference --      Peak Flow --      Pain Score 01/30/16 1139 7     Pain Loc --      Pain Edu? --      Excl. in [place]? --     Constitutional: Alert and oriented. Well appearing and in no acute distress. Eyes: Conjunctivae are normal. PERRL. EOMI. Head: Atraumatic. Nose: No congestion/rhinnorhea. Mouth/Throat: Mucous membranes are moist.  Oropharynx non-erythematous. Neck: No stridor.   Nontender with no meningismus Cardiovascular: Normal rate, regular rhythm. Grossly normal heart sounds.  Good peripheral circulation. Respiratory: Normal respiratory effort.  No retractions. Lungs CTAB. Abdominal: Soft and Positive suprapubic tenderness. No distention. No guarding no rebound Back:  There is no focal tenderness or step off.  there is no midline tenderness there are no lesions noted. there is no CVA tenderness GU: There is clear feculent material seen  in the vagina with no obvious other lesion visible though ability to visualize the entire vault is limited by fecal matter.  Musculoskeletal: No lower extremity tenderness, no upper extremity tenderness. No joint effusions, no DVT signs strong distal pulses no edema Neurologic:  Normal speech and language. No gross focal neurologic deficits are appreciated.  Skin:  Skin is warm, dry and intact. No rash noted. Psychiatric: Mood and affect are normal. Speech and behavior are normal.  ____________________________________________    LABS (all labs ordered are listed, but only abnormal results are displayed)  Labs Reviewed  COMPREHENSIVE METABOLIC PANEL - Abnormal; Notable for the following:       Result Value   Sodium 126 (*)    Potassium 3.4 (*)    Chloride 89 (*)    Glucose, Bld 118 (*)    BUN <5 (*)    ALT 11 (*)    All other components within normal limits  URINE CULTURE  CBC  URINALYSIS COMPLETEWITH MICROSCOPIC ([facility] ONLY)  LACTIC ACID, PLASMA  LACTIC ACID, PLASMA   ____________________________________________  EKG  I personally interpreted any EKGs ordered by me or triage  ____________________________________________  RADIOLOGY  I reviewed any imaging ordered by me or triage that were performed during my shift and, if possible, patient and/or family made aware of any abnormal findings. ____________________________________________   PROCEDURES  Procedure(s) performed: None  Procedures  Critical Care performed: None  ____________________________________________   INITIAL IMPRESSION / ASSESSMENT AND PLAN / ED COURSE  Pertinent labs & imaging results that were available during my care of the patient were reviewed by me and considered in my medical decision making (see chart for details).  Patient with enterovaginal fistula with significant stool production and rectal constipation. We'll obtain CT given abdominal pain and fever out of concern for possible abscess or intraperitoneal pathology. Blood work however is reassuring she is afebrile here. We will do a catheter urine to see if we can obtain a sample for urinalysis, patient nontoxic in appearance. Signed out at the end of my shift to dr. Archie Balboa.  Clinical Course   ____________________________________________   FINAL CLINICAL IMPRESSION(S) / ED DIAGNOSES  Final diagnoses:  None      This chart was dictated using voice recognition software.  Despite best efforts to proofread,  errors can occur which can change meaning.       Schuyler Amor, MD 01/30/16 [place], MD 02/01/16 [phone]

## 2016-01-30 NOTE — ED Notes (Signed)
Pt unable to drink more than one bottle of contrast; CT notified.

## 2016-01-30 NOTE — Discharge Instructions (Signed)
Please give [facility] a call to start setting up appointments with the surgeons to help with your enterovaginal fistula. Please seek medical attention for any high fevers, chest pain, shortness of breath, change in behavior, persistent vomiting, bloody stool or any other new or concerning symptoms.

## 2016-01-30 NOTE — ED Notes (Addendum)
Pt comes in today w/ c/o watery stool via vagina. 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 Pt states this stool passing through the vagina began approx 1 month ago but has become more frequent within the past week. Pt c/o abdominal pain that is intermittent but this pain is not new onset, pain rated 7/10. Pain generalized but more intense on LLQ. Pt denies nausea vomiting. Temp.99.4 .Pt AOx4.

## 2016-01-30 NOTE — ED Notes (Signed)
Pt ambulated to restroom. 

## 2016-01-30 NOTE — ED Notes (Signed)
Reviewed d/c instructions, prescriptions, and follow-up care with pt. Pt verbalized understanding

## 2016-01-30 NOTE — ED Triage Notes (Signed)
Pt states that she has a rectal/vaginal fistula and has had more stool passing through her vagina than her rectum and having abd pain as well.

## 2016-01-30 NOTE — ED Notes (Signed)
Pt returned from ct

## 2016-01-31 LAB — URINE CULTURE: Culture: NO GROWTH

## 2016-02-03 ENCOUNTER — Telehealth: Payer: Self-pay | Admitting: Emergency Medicine

## 2016-02-03 NOTE — Telephone Encounter (Addendum)
Called pt due to incidental finding on imaging of pulmonary nodules.  Would like to find out if pt has pcp so that she can have follow up ct done in 6 months.  I left a message asking patient to call me.  02/05/16  Contacted patient and she has appt with [facility] on oct 3.  She says she will tell the oncologist about the pulmonary nodules.she does not have a pcp, but she plans to get one.

## 2016-09-20 DIAGNOSIS — E079 Disorder of thyroid, unspecified: Secondary | ICD-10-CM | POA: Insufficient documentation

## 2016-09-20 DIAGNOSIS — Z8739 Personal history of other diseases of the musculoskeletal system and connective tissue: Secondary | ICD-10-CM | POA: Insufficient documentation

## 2016-09-20 DIAGNOSIS — K219 Gastro-esophageal reflux disease without esophagitis: Secondary | ICD-10-CM | POA: Insufficient documentation

## 2016-09-20 DIAGNOSIS — Z720 Tobacco use: Secondary | ICD-10-CM | POA: Insufficient documentation

## 2016-09-20 DIAGNOSIS — F1190 Opioid use, unspecified, uncomplicated: Secondary | ICD-10-CM | POA: Insufficient documentation

## 2016-12-13 ENCOUNTER — Other Ambulatory Visit: Payer: Self-pay | Admitting: Anesthesiology

## 2016-12-13 ENCOUNTER — Other Ambulatory Visit (HOSPITAL_COMMUNITY): Payer: Self-pay | Admitting: Anesthesiology

## 2016-12-13 DIAGNOSIS — M545 Low back pain: Secondary | ICD-10-CM

## 2017-02-04 ENCOUNTER — Ambulatory Visit
Admission: RE | Admit: 2017-02-04 | Discharge: 2017-02-04 | Disposition: A | Discharge status: Home / Self Care | Payer: Medicare Other | Source: Ambulatory Visit | Attending: Anesthesiology | Admitting: Anesthesiology

## 2017-02-04 DIAGNOSIS — I700 Atherosclerosis of aorta: Secondary | ICD-10-CM | POA: Diagnosis not present

## 2017-02-04 DIAGNOSIS — M4186 Other forms of scoliosis, lumbar region: Secondary | ICD-10-CM | POA: Insufficient documentation

## 2017-02-04 DIAGNOSIS — M47896 Other spondylosis, lumbar region: Secondary | ICD-10-CM | POA: Insufficient documentation

## 2017-02-04 DIAGNOSIS — M549 Dorsalgia, unspecified: Secondary | ICD-10-CM | POA: Insufficient documentation

## 2017-02-04 DIAGNOSIS — Z9889 Other specified postprocedural states: Secondary | ICD-10-CM | POA: Diagnosis not present

## 2017-02-04 DIAGNOSIS — M545 Low back pain: Secondary | ICD-10-CM

## 2017-05-20 ENCOUNTER — Other Ambulatory Visit: Payer: Self-pay | Admitting: Orthopaedic Surgery

## 2017-05-20 DIAGNOSIS — M4326 Fusion of spine, lumbar region: Secondary | ICD-10-CM

## 2017-06-06 ENCOUNTER — Ambulatory Visit
Admission: RE | Admit: 2017-06-06 | Discharge: 2017-06-06 | Disposition: A | Discharge status: Home / Self Care | Payer: Medicare Other | Source: Ambulatory Visit | Attending: Orthopaedic Surgery | Admitting: Orthopaedic Surgery

## 2017-06-06 DIAGNOSIS — M4326 Fusion of spine, lumbar region: Secondary | ICD-10-CM

## 2018-11-03 ENCOUNTER — Telehealth: Payer: Self-pay | Admitting: *Deleted

## 2018-11-03 ENCOUNTER — Ambulatory Visit (INDEPENDENT_AMBULATORY_CARE_PROVIDER_SITE_OTHER)
Admission: RE | Admit: 2018-11-03 | Discharge: 2018-11-03 | Disposition: A | Discharge status: Home / Self Care | Payer: Medicare Other | Source: Ambulatory Visit

## 2018-11-03 DIAGNOSIS — F17200 Nicotine dependence, unspecified, uncomplicated: Secondary | ICD-10-CM | POA: Diagnosis not present

## 2018-11-03 DIAGNOSIS — Z20822 Contact with and (suspected) exposure to covid-19: Secondary | ICD-10-CM

## 2018-11-03 DIAGNOSIS — J418 Mixed simple and mucopurulent chronic bronchitis: Secondary | ICD-10-CM

## 2018-11-03 MED ORDER — DOXYCYCLINE HYCLATE 100 MG PO CAPS: 100.0000 mg | ORAL_CAPSULE | Freq: Two times a day (BID) | ORAL | 0 refills | Status: DC

## 2018-11-03 MED ORDER — DOXYCYCLINE HYCLATE 100 MG PO CAPS: 100.0000 mg | ORAL_CAPSULE | Freq: Two times a day (BID) | ORAL | 0 refills | Status: AC

## 2018-11-03 NOTE — ED Provider Notes (Signed)
Virtual Visit via Video Note:  Tonya Rush  initiated request for Telemedicine visit with [facility] team. I connected with Tonya Rush  on 11/04/2018 at 10:20 AM  for a synchronized telemedicine visit using a video enabled HIPPA compliant telemedicine application. I verified that I am speaking with Tonya Rush  using two identifiers. Tonya Hall-[place]vin, PA-C  was physically located in a [facility] site and Tonya Rush was located at a different location.   The limitations of evaluation and management by telemedicine as well as the availability of in-person appointments were discussed. Patient was informed that she  may incur a bill ( including co-pay) for this virtual visit encounter. Tonya Rush  expressed understanding and gave verbal consent to proceed with virtual visit.    History of Present Illness:Tonya Rush  is a 58 y.o. female with history of nicotine dependence, GERD, thyroid disease presenting for possible URI.  Patient states that she has had viral laryngitis in April, was seen by [facility] at that time.  Patient states that her voice resolved, though she has noticed a productive cough for over a month now.  Patient states cough is non-hemoptic, does not cause her to have shortness of breath.  She has been able to check her temperature at home: 97.6 F today.  Still smoking, though has decreased this to about half pack daily due still having cough.  Patient denies eye, ear, nose, throat pain, odynophagia, choking, audible wheezing, chest pain, abdominal pain.  Patient has had adequate appetite: Denies nausea, vomiting, diarrhea.  Patient states that she has not left the house since quarantining in March.  Denies recent sick contacts, contact with known COVID positive persons.  Past Medical History:  Diagnosis Date  . Cancer ([place])   . Thyroid disease     Allergies  Allergen Reactions  . Penicillins Other (See Comments)    Felt like her  heart was going to beat out of her chest. Has patient had a PCN reaction causing immediate rash, facial/tongue/throat swelling, SOB or lightheadedness with hypotension:No Has patient had a PCN reaction causing severe rash involving mucus membranes or skin necrosis: No Has patient had a PCN reaction that required hospitalization No Has patient had a PCN reaction occurring within the last 10 years:No If all of the above answers are "NO", then may proceed with Cephalosporin use.         Observations/Objective: 58 year old female sitting in no acute distress.  Able to complete full sentences without wheezing, pausing.  No obvious goiter  Assessment and Plan: 58 year old female with productive cough for over 1 month.  Will treat as chronic bronchitis.  Patient had previous EKG where QT prolongation was noted, will avoid azithromycin.  Start doxycycline and follow-up with PCP.  We will also order COVID testing due to community-acquired COVID, and patient's comorbidities to better assess risk should symptoms not improve.  Follow Up Instructions: Patient to return in clinic if symptoms do not improve, otherwise follow-up with PCP.    I discussed the assessment and treatment plan with the patient. The patient was provided an opportunity to ask questions and all were answered. The patient agreed with the plan and demonstrated an understanding of the instructions.   The patient was advised to call back or seek an in-person evaluation if the symptoms worsen or if the condition fails to improve as anticipated.  I provided 25 minutes of non-face-to-face time during this encounter.  [place], PA-C  11/04/2018 10:20 AM        Rush, [place], PA-C 11/04/18 1020

## 2018-11-03 NOTE — Telephone Encounter (Signed)
-----   Message from [place], [place] sent at 11/03/2018  6:02 PM EDT ----- Regarding: NEEDS COVID TESTING Smoker (unknown COPD status), cough > 2 months, malaise, abdominal pain

## 2018-11-03 NOTE — Telephone Encounter (Addendum)
Pt called to be scheduled for testing but but states she needs to speak with her husband before scheduling appt. Pt will return call to [phone]. Pt advised that if she was not able to return the call today before 7 pm she could return call on Monday.  Understanding verbalized.

## 2019-01-30 ENCOUNTER — Ambulatory Visit: Payer: Medicare Other | Admitting: Gastroenterology

## 2019-02-26 ENCOUNTER — Other Ambulatory Visit: Payer: Self-pay

## 2019-02-26 DIAGNOSIS — N824 Other female intestinal-genital tract fistulae: Secondary | ICD-10-CM | POA: Insufficient documentation

## 2019-02-27 ENCOUNTER — Other Ambulatory Visit: Payer: Self-pay

## 2019-02-27 ENCOUNTER — Encounter: Payer: Self-pay | Admitting: Gastroenterology

## 2019-02-27 ENCOUNTER — Ambulatory Visit: Payer: Medicare Other | Admitting: Gastroenterology

## 2019-02-27 VITALS — BP 147/92 | HR 98 | Temp 98.3°F | Ht 65.0 in | Wt 137.6 lb

## 2019-02-27 DIAGNOSIS — K5904 Chronic idiopathic constipation: Secondary | ICD-10-CM | POA: Diagnosis not present

## 2019-02-27 DIAGNOSIS — Z1211 Encounter for screening for malignant neoplasm of colon: Secondary | ICD-10-CM | POA: Diagnosis not present

## 2019-02-27 MED ORDER — PEG 3350-KCL-NA BICARB-NACL 420 G PO SOLR: ORAL | 0 refills | Status: AC

## 2019-02-27 NOTE — H&P (View-Only) (Signed)
Gastroenterology Consultation  Referring Provider:     Margaretha Sheffield, MD Primary Care Physician:  Denton Lank, MD Primary Gastroenterologist:  Dr. Allen Norris     Reason for Consultation:     constipation        HPI:   Tonya Rush is a 58 y.o. y/o female referred for consultation & management of constipation by Dr. Denton Lank, MD.  This patient comes in today with a report of constipation and the need for a colonoscopy.  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  She had surgery at [facility] for this and a history of a hysterectomy.  The patient states that she was supposed to follow up with [facility] but had not.  She reports that she takes 3 laxatives per day to have a bowel movement.  She denies any black stools or bloody stools.  She also denies any unexplained weight loss.  She states that she feels bloated. The patient reports that she feels better after she has moved her bowels. She reports that she has also had back surgery for chronic back pain with screws put in her back.  The patient was seen by her ENT physician who recommended she come see me.  Past Medical History:  Diagnosis Date  . Cancer ([place])   . Thyroid disease     Past Surgical History:  Procedure Laterality Date  . BACK SURGERY      Prior to Admission medications   Medication Sig Start Date End Date Taking? Authorizing Provider  aspirin EC 81 MG tablet Take 81 mg by mouth daily.   Yes [provider]  levothyroxine (SYNTHROID, LEVOTHROID) 125 MCG tablet Take 125 mcg by mouth daily before breakfast.   Yes [provider]  Melatonin 5 MG CAPS Take by mouth.   Yes [provider]  Multiple Vitamin (MULTI-VITAMIN) tablet Take by mouth.   Yes [provider]  omeprazole (PRILOSEC) 20 MG capsule Take 20 mg by mouth daily.   Yes [provider]  acetaminophen  (TYLENOL) 500 MG tablet Take by mouth.    [provider]  Aspirin-Caffeine 500-32.5 MG TABS Take by mouth.    [provider]  carisoprodol (SOMA) 350 MG tablet Take 350 mg by mouth every 8 (eight) hours.    [provider]  diphenhydrAMINE (BENADRYL) 25 MG tablet Take 25 mg by mouth as needed.    [provider]  famotidine (PEPCID) 20 MG tablet Take 20 mg by mouth daily.    [provider]  lactobacillus acidophilus & bulgar (LACTINEX) chewable tablet Chew by mouth.    [provider]  liothyronine (CYTOMEL) 5 MCG tablet Take 5 mcg by mouth daily.    [provider]  LORazepam (ATIVAN) 1 MG tablet Take 1 mg by mouth every 8 (eight) hours.    [provider]  methadone (DOLOPHINE) 5 MG tablet Take 5 mg by mouth every 8 (eight) hours.    [provider]  oxyCODONE (OXY IR/ROXICODONE) 5 MG immediate release tablet Take 5 mg by mouth every 4 (four) hours as needed for severe pain. 25 mg per day    [provider]  simethicone (MYLICON) 0000000 MG chewable tablet Chew 125 mg by mouth as needed for flatulence.    [provider]  traZODone (DESYREL) 50 MG tablet Take 100 mg by mouth 2 (two) times daily.  [provider]    History reviewed. No pertinent family history.   Social History   Tobacco Use  . Smoking status: Current Some Day Smoker  . Smokeless tobacco: Never Used  Substance Use Topics  . Alcohol use: No  . Drug use: Not on file    Allergies as of 02/27/2019 - Review Complete 02/27/2019  Allergen Reaction Noted  . Chlorhexidine Hives, Rash, and Swelling 05/28/2016  . Tape Rash 12/12/2017  . Penicillins Other (See Comments) 01/30/2016    Review of Systems:    All systems reviewed and negative except where noted in HPI.   Physical Exam:  BP (!) 147/92   Pulse 98   Temp 98.3 F (36.8 C) (Oral)   Ht 5\' 5"  (1.651 m)   Wt 137 lb 9.6 oz (62.4 kg)   BMI 22.90 kg/m  No  LMP recorded. Patient has had a hysterectomy. General:   Alert,  Well-developed, well-nourished, pleasant and cooperative in NAD Head:  Normocephalic and atraumatic. Eyes:  Sclera clear, no icterus.   Conjunctiva pink. Ears:  Normal auditory acuity. Nose:  No deformity, discharge, or lesions. Mouth:  No deformity or lesions,oropharynx pink & moist. Neck:  Supple; no masses or thyromegaly. Lungs:  Respirations even and unlabored.  Clear throughout to auscultation.   No wheezes, crackles, or rhonchi. No acute distress. Heart:  Regular rate and rhythm; no murmurs, clicks, rubs, or gallops. Abdomen:  Normal bowel sounds.  No bruits.  Soft, non-tender and non-distended without masses, hepatosplenomegaly or hernias noted.  No guarding or rebound tenderness.  Negative Carnett sign.   Rectal:  Deferred.  Msk:  Symmetrical without gross deformities.  Good, equal movement & strength bilaterally. Pulses:  Normal pulses noted. Extremities:  No clubbing or edema.  No cyanosis. Neurologic:  Alert and oriented x3;  grossly normal neurologically. Skin:  Intact without significant lesions or rashes.  No jaundice. Lymph Nodes:  No significant cervical adenopathy. Psych:  Alert and cooperative. Normal mood and affect.  Imaging Studies: No results found.  Assessment and Plan:   Tonya Rush is a 58 y.o. y/o female who comes in today with a history of constipation. 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 Despite the patient being referred to me for GERD she has no symptoms of that at the present time.  The patient's main concern is that she have a follow-up colonoscopy after her surgery. The patient will continue her present medication for her constipation since it seems to be working and she is not having any side effects from it.The patient will be set up for a colonoscopy for screening purposes.  The patient's constipation is likely cause from her  surgery. I have discussed risks & benefits which include, but are not limited to, bleeding, infection, perforation & drug reaction.  The patient agrees with this plan & written consent will be obtained.     This visit consisted of 35 minutes face to face contact with myself and at least 50% of this time was spent in counseling and education regarding diagnosis, treatment options, medication management, risks and benefits of treatment.  Lucilla Lame, MD. Marval Regal    Note: This dictation was prepared with Dragon dictation along with smaller phrase technology. Any transcriptional errors that result from this process are unintentional.

## 2019-02-27 NOTE — Progress Notes (Signed)
Gastroenterology Consultation  Referring Provider:     Margaretha Sheffield, MD Primary Care Physician:  Denton Lank, MD Primary Gastroenterologist:  Dr. Allen Norris     Reason for Consultation:     constipation        HPI:   Tonya Rush is a 58 y.o. y/o female referred for consultation & management of constipation by Dr. Denton Lank, MD.  This patient comes in today with a report of constipation and the need for a colonoscopy.  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  She had surgery at [facility] for this and a history of a hysterectomy.  The patient states that she was supposed to follow up with [facility] but had not.  She reports that she takes 3 laxatives per day to have a bowel movement.  She denies any black stools or bloody stools.  She also denies any unexplained weight loss.  She states that she feels bloated. The patient reports that she feels better after she has moved her bowels. She reports that she has also had back surgery for chronic back pain with screws put in her back.  The patient was seen by her ENT physician who recommended she come see me.  Past Medical History:  Diagnosis Date  . Cancer ([place])   . Thyroid disease     Past Surgical History:  Procedure Laterality Date  . BACK SURGERY      Prior to Admission medications   Medication Sig Start Date End Date Taking? Authorizing Provider  aspirin EC 81 MG tablet Take 81 mg by mouth daily.   Yes [provider]  levothyroxine (SYNTHROID, LEVOTHROID) 125 MCG tablet Take 125 mcg by mouth daily before breakfast.   Yes [provider]  Melatonin 5 MG CAPS Take by mouth.   Yes [provider]  Multiple Vitamin (MULTI-VITAMIN) tablet Take by mouth.   Yes [provider]  omeprazole (PRILOSEC) 20 MG capsule Take 20 mg by mouth daily.   Yes [provider]  acetaminophen  (TYLENOL) 500 MG tablet Take by mouth.    [provider]  Aspirin-Caffeine 500-32.5 MG TABS Take by mouth.    [provider]  carisoprodol (SOMA) 350 MG tablet Take 350 mg by mouth every 8 (eight) hours.    [provider]  diphenhydrAMINE (BENADRYL) 25 MG tablet Take 25 mg by mouth as needed.    [provider]  famotidine (PEPCID) 20 MG tablet Take 20 mg by mouth daily.    [provider]  lactobacillus acidophilus & bulgar (LACTINEX) chewable tablet Chew by mouth.    [provider]  liothyronine (CYTOMEL) 5 MCG tablet Take 5 mcg by mouth daily.    [provider]  LORazepam (ATIVAN) 1 MG tablet Take 1 mg by mouth every 8 (eight) hours.    [provider]  methadone (DOLOPHINE) 5 MG tablet Take 5 mg by mouth every 8 (eight) hours.    [provider]  oxyCODONE (OXY IR/ROXICODONE) 5 MG immediate release tablet Take 5 mg by mouth every 4 (four) hours as needed for severe pain. 25 mg per day    [provider]  simethicone (MYLICON) 0000000 MG chewable tablet Chew 125 mg by mouth as needed for flatulence.    [provider]  traZODone (DESYREL) 50 MG tablet Take 100 mg by mouth 2 (two) times daily.  [provider]    History reviewed. No pertinent family history.   Social History   Tobacco Use  . Smoking status: Current Some Day Smoker  . Smokeless tobacco: Never Used  Substance Use Topics  . Alcohol use: No  . Drug use: Not on file    Allergies as of 02/27/2019 - Review Complete 02/27/2019  Allergen Reaction Noted  . Chlorhexidine Hives, Rash, and Swelling 05/28/2016  . Tape Rash 12/12/2017  . Penicillins Other (See Comments) 01/30/2016    Review of Systems:    All systems reviewed and negative except where noted in HPI.   Physical Exam:  BP (!) 147/92   Pulse 98   Temp 98.3 F (36.8 C) (Oral)   Ht 5\' 5"  (1.651 m)   Wt 137 lb 9.6 oz (62.4 kg)   BMI 22.90 kg/m  No  LMP recorded. Patient has had a hysterectomy. General:   Alert,  Well-developed, well-nourished, pleasant and cooperative in NAD Head:  Normocephalic and atraumatic. Eyes:  Sclera clear, no icterus.   Conjunctiva pink. Ears:  Normal auditory acuity. Nose:  No deformity, discharge, or lesions. Mouth:  No deformity or lesions,oropharynx pink & moist. Neck:  Supple; no masses or thyromegaly. Lungs:  Respirations even and unlabored.  Clear throughout to auscultation.   No wheezes, crackles, or rhonchi. No acute distress. Heart:  Regular rate and rhythm; no murmurs, clicks, rubs, or gallops. Abdomen:  Normal bowel sounds.  No bruits.  Soft, non-tender and non-distended without masses, hepatosplenomegaly or hernias noted.  No guarding or rebound tenderness.  Negative Carnett sign.   Rectal:  Deferred.  Msk:  Symmetrical without gross deformities.  Good, equal movement & strength bilaterally. Pulses:  Normal pulses noted. Extremities:  No clubbing or edema.  No cyanosis. Neurologic:  Alert and oriented x3;  grossly normal neurologically. Skin:  Intact without significant lesions or rashes.  No jaundice. Lymph Nodes:  No significant cervical adenopathy. Psych:  Alert and cooperative. Normal mood and affect.  Imaging Studies: No results found.  Assessment and Plan:   Tonya Rush is a 58 y.o. y/o female who comes in today with a history of constipation. 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 Despite the patient being referred to me for GERD she has no symptoms of that at the present time.  The patient's main concern is that she have a follow-up colonoscopy after her surgery. The patient will continue her present medication for her constipation since it seems to be working and she is not having any side effects from it.The patient will be set up for a colonoscopy for screening purposes.  The patient's constipation is likely cause from her  surgery. I have discussed risks & benefits which include, but are not limited to, bleeding, infection, perforation & drug reaction.  The patient agrees with this plan & written consent will be obtained.     This visit consisted of 35 minutes face to face contact with myself and at least 50% of this time was spent in counseling and education regarding diagnosis, treatment options, medication management, risks and benefits of treatment.  Lucilla Lame, MD. Marval Regal    Note: This dictation was prepared with Dragon dictation along with smaller phrase technology. Any transcriptional errors that result from this process are unintentional.

## 2019-03-07 ENCOUNTER — Other Ambulatory Visit: Payer: Self-pay

## 2019-03-07 ENCOUNTER — Encounter: Payer: Self-pay | Admitting: *Deleted

## 2019-03-08 ENCOUNTER — Telehealth: Payer: Self-pay | Admitting: Gastroenterology

## 2019-03-08 ENCOUNTER — Other Ambulatory Visit: Payer: Self-pay

## 2019-03-08 NOTE — Telephone Encounter (Signed)
Spoke with pt regarding her daily Aspirin use. Pt stated she was taking over 500mg  daily for her back pain. I advised that she would need to stop that as its too much to continue taking prior to her colonoscopy and she would be a bleeding risk. Pt stated she saw her PCP this morning and they advised her to contact us about the daily aspirin use. I advised pt we do not monitor that and she needs to discuss her back issues with her PCP to see if there are other options available to her. I also explain to her she is at risk of stomach ulcers with that much use. Pt become upset and stated she would need to call me back Monday when she decided if she wanted to move forward with her colonoscopy or not.

## 2019-03-08 NOTE — Telephone Encounter (Signed)
Pt is calling she has a couple questions regarding her procedure [place] had told her she could take rx asprine up until her colonoscopy she wanted to double check on that as well as if she could take rx Colace please call pt

## 2019-03-12 ENCOUNTER — Other Ambulatory Visit: Payer: Self-pay

## 2019-03-12 ENCOUNTER — Telehealth: Payer: Self-pay | Admitting: Gastroenterology

## 2019-03-12 ENCOUNTER — Other Ambulatory Visit
Admission: RE | Admit: 2019-03-12 | Discharge: 2019-03-12 | Disposition: A | Discharge status: Home / Self Care | Payer: Medicare Other | Source: Ambulatory Visit | Attending: Gastroenterology | Admitting: Gastroenterology

## 2019-03-12 DIAGNOSIS — Z20828 Contact with and (suspected) exposure to other viral communicable diseases: Secondary | ICD-10-CM | POA: Diagnosis not present

## 2019-03-12 DIAGNOSIS — Z01812 Encounter for preprocedural laboratory examination: Secondary | ICD-10-CM | POA: Insufficient documentation

## 2019-03-12 LAB — SARS CORONAVIRUS 2 (TAT 6-24 HRS): SARS Coronavirus 2: NEGATIVE

## 2019-03-12 NOTE — Telephone Encounter (Signed)
Pt left vm for Ginger regarding some confusion on a pain medication. She states if Ginger has any questions to give her a call

## 2019-03-12 NOTE — Telephone Encounter (Signed)
Contacted pt regarding her message today. Pt wanted to let me know she had stopped her daily Asprin and started OTC Tylenol. She has scheduled an appt with her back specialist for tomorrow to discuss other pain relief options instead of taking so much Aspirin daily.

## 2019-03-14 NOTE — Discharge Instructions (Signed)

## 2019-03-15 ENCOUNTER — Ambulatory Visit: Payer: Medicare Other | Admitting: Anesthesiology

## 2019-03-15 ENCOUNTER — Ambulatory Visit
Admission: RE | Admit: 2019-03-15 | Discharge: 2019-03-15 | Disposition: A | Discharge status: Home / Self Care | Payer: Medicare Other | Attending: Gastroenterology | Admitting: Gastroenterology

## 2019-03-15 ENCOUNTER — Encounter
Admission: RE | Disposition: A | Discharge status: Home / Self Care | Payer: Self-pay | Source: Home / Self Care | Attending: Gastroenterology

## 2019-03-15 ENCOUNTER — Other Ambulatory Visit: Payer: Self-pay

## 2019-03-15 DIAGNOSIS — K640 First degree hemorrhoids: Secondary | ICD-10-CM | POA: Insufficient documentation

## 2019-03-15 DIAGNOSIS — K219 Gastro-esophageal reflux disease without esophagitis: Secondary | ICD-10-CM | POA: Insufficient documentation

## 2019-03-15 DIAGNOSIS — Z79899 Other long term (current) drug therapy: Secondary | ICD-10-CM | POA: Insufficient documentation

## 2019-03-15 DIAGNOSIS — K5900 Constipation, unspecified: Secondary | ICD-10-CM | POA: Insufficient documentation

## 2019-03-15 DIAGNOSIS — F17200 Nicotine dependence, unspecified, uncomplicated: Secondary | ICD-10-CM | POA: Insufficient documentation

## 2019-03-15 DIAGNOSIS — Z91048 Other nonmedicinal substance allergy status: Secondary | ICD-10-CM | POA: Diagnosis not present

## 2019-03-15 DIAGNOSIS — Z859 Personal history of malignant neoplasm, unspecified: Secondary | ICD-10-CM | POA: Diagnosis not present

## 2019-03-15 DIAGNOSIS — K5730 Diverticulosis of large intestine without perforation or abscess without bleeding: Secondary | ICD-10-CM | POA: Diagnosis not present

## 2019-03-15 DIAGNOSIS — Z980 Intestinal bypass and anastomosis status: Secondary | ICD-10-CM | POA: Insufficient documentation

## 2019-03-15 DIAGNOSIS — Z7982 Long term (current) use of aspirin: Secondary | ICD-10-CM | POA: Diagnosis not present

## 2019-03-15 DIAGNOSIS — Z880 Allergy status to penicillin: Secondary | ICD-10-CM | POA: Diagnosis not present

## 2019-03-15 DIAGNOSIS — Z1211 Encounter for screening for malignant neoplasm of colon: Secondary | ICD-10-CM | POA: Diagnosis not present

## 2019-03-15 DIAGNOSIS — Z888 Allergy status to other drugs, medicaments and biological substances status: Secondary | ICD-10-CM | POA: Insufficient documentation

## 2019-03-15 DIAGNOSIS — E079 Disorder of thyroid, unspecified: Secondary | ICD-10-CM | POA: Diagnosis not present

## 2019-03-15 HISTORY — DX: Gastro-esophageal reflux disease without esophagitis: K21.9

## 2019-03-15 HISTORY — PX: COLONOSCOPY WITH PROPOFOL: SHX5780

## 2019-03-15 HISTORY — DX: Presence of dental prosthetic device (complete) (partial): Z97.2

## 2019-03-15 HISTORY — DX: Pure hypercholesterolemia, unspecified: E78.00

## 2019-03-15 HISTORY — DX: Other complications of anesthesia, initial encounter: T88.59XA

## 2019-03-15 HISTORY — DX: Abnormal electrocardiogram (ECG) (EKG): R94.31

## 2019-03-15 HISTORY — DX: Hypothyroidism, unspecified: E03.9

## 2019-03-15 HISTORY — DX: Unspecified osteoarthritis, unspecified site: M19.90

## 2019-03-15 SURGERY — COLONOSCOPY WITH PROPOFOL
Anesthesia: General | Site: Rectum

## 2019-03-15 MED ORDER — PROPOFOL 10 MG/ML IV BOLUS
INTRAVENOUS | Status: DC | PRN
  Administered 2019-03-15: 20 mg via INTRAVENOUS
  Administered 2019-03-15: 70 mg via INTRAVENOUS
  Administered 2019-03-15: 20 mg via INTRAVENOUS
  Administered 2019-03-15: 30 mg via INTRAVENOUS

## 2019-03-15 MED ORDER — SODIUM CHLORIDE 0.9 % IV SOLN: INTRAVENOUS | Status: DC

## 2019-03-15 MED ORDER — STERILE WATER FOR IRRIGATION IR SOLN
Status: DC | PRN
  Administered 2019-03-15: .05 mL

## 2019-03-15 MED ORDER — LACTATED RINGERS IV SOLN
INTRAVENOUS | Status: DC
  Administered 2019-03-15: 09:00:00 via INTRAVENOUS

## 2019-03-15 MED ORDER — LIDOCAINE HCL (CARDIAC) PF 100 MG/5ML IV SOSY
PREFILLED_SYRINGE | INTRAVENOUS | Status: DC | PRN
  Administered 2019-03-15: 50 mg via INTRAVENOUS

## 2019-03-15 SURGICAL SUPPLY — 5 items
CANISTER SUCT 1200ML W/VALVE (MISCELLANEOUS) ×3 IMPLANT
GOWN CVR UNV OPN BCK APRN NK (MISCELLANEOUS) ×2 IMPLANT
GOWN ISOL THUMB LOOP REG UNIV (MISCELLANEOUS) ×4
KIT ENDO PROCEDURE OLY (KITS) ×3 IMPLANT
WATER STERILE IRR 250ML POUR (IV SOLUTION) ×3 IMPLANT

## 2019-03-15 NOTE — Anesthesia Postprocedure Evaluation (Signed)
Anesthesia Post Note  Patient: Radio broadcast assistant  Procedure(s) Performed: COLONOSCOPY WITH PROPOFOL (N/A Rectum)  Patient location during evaluation: PACU Anesthesia Type: General Level of consciousness: awake and alert Pain management: pain level controlled Vital Signs Assessment: post-procedure vital signs reviewed and stable Respiratory status: spontaneous breathing, nonlabored ventilation, respiratory function stable and patient connected to nasal cannula oxygen Cardiovascular status: blood pressure returned to baseline and stable Postop Assessment: no apparent nausea or vomiting Anesthetic complications: no    Trecia Rogers

## 2019-03-15 NOTE — Interval H&P Note (Signed)
History and Physical Interval Note:  03/15/2019 8:42 AM  [place]  has presented today for surgery, with the diagnosis of Screening Z12.11.  The various methods of treatment have been discussed with the patient and family. After consideration of risks, benefits and other options for treatment, the patient has consented to  Procedure(s): COLONOSCOPY WITH PROPOFOL (N/A) as a surgical intervention.  The patient's history has been reviewed, patient examined, no change in status, stable for surgery.  I have reviewed the patient's chart and labs.  Questions were answered to the patient's satisfaction.     Kieren Adkison [employer]

## 2019-03-15 NOTE — Anesthesia Preprocedure Evaluation (Signed)
Anesthesia Evaluation  Patient identified by MRN, date of birth, ID band Patient awake    Reviewed: Allergy & Precautions, H&P , NPO status , Patient's Chart, lab work & pertinent test results, reviewed documented beta blocker date and time   Airway Mallampati: II  TM Distance: >3 FB Neck ROM: full    Dental no notable dental hx.    Pulmonary neg pulmonary ROS, former smoker,    Pulmonary exam normal breath sounds clear to auscultation       Cardiovascular Exercise Tolerance: Good negative cardio ROS Normal cardiovascular exam Rhythm:regular Rate:Normal     Neuro/Psych negative neurological ROS  negative psych ROS   GI/Hepatic Neg liver ROS, GERD  Medicated and Controlled,  Endo/Other  negative endocrine ROSHypothyroidism   Renal/GU negative Renal ROS  negative genitourinary   Musculoskeletal   Abdominal   Peds  Hematology negative hematology ROS (+)   Anesthesia Other Findings   Reproductive/Obstetrics negative OB ROS                             Anesthesia Physical Anesthesia Plan  ASA: II  Anesthesia Plan: General   Post-op Pain Management:    Induction:   PONV Risk Score and Plan: Propofol infusion  Airway Management Planned:   Additional Equipment:   Intra-op Plan:   Post-operative Plan:   Informed Consent: I have reviewed the patients History and Physical, chart, labs and discussed the procedure including the risks, benefits and alternatives for the proposed anesthesia with the patient or authorized representative who has indicated his/her understanding and acceptance.     Dental Advisory Given  Plan Discussed with: CRNA  Anesthesia Plan Comments:         Anesthesia Quick Evaluation

## 2019-03-15 NOTE — Op Note (Signed)
[facility] Patient Name: Tonya Rush Procedure Date: 03/15/2019 9:53 AM MRN: [id number] Account #: [id number] Date of Birth: 1961/03/23 Admit Type: Outpatient Age: 58 Room: [facility] OR ROOM 01 Gender: Female Note Status: Finalized Procedure:            Colonoscopy Indications:          Screening for colorectal malignant neoplasm Providers:            Lucilla Lame MD, MD Referring MD:         Denton Lank MD, MD (Referring MD) Medicines:            Propofol per Anesthesia Complications:        No immediate complications. Procedure:            Pre-Anesthesia Assessment:                       - Prior to the procedure, a History and Physical was                        performed, and patient medications and allergies were                        reviewed. The patient's tolerance of previous                        anesthesia was also reviewed. The risks and benefits of                        the procedure and the sedation options and risks were                        discussed with the patient. All questions were                        answered, and informed consent was obtained. Prior                        Anticoagulants: The patient has taken no previous                        anticoagulant or antiplatelet agents. ASA Grade                        Assessment: II - A patient with mild systemic disease.                        After reviewing the risks and benefits, the patient was                        deemed in satisfactory condition to undergo the                        procedure.                       After obtaining informed consent, the colonoscope was                        passed under direct vision. Throughout the procedure,  the patient's blood pressure, pulse, and oxygen                        saturations were monitored continuously. The was                        introduced through the anus and advanced to the the                 cecum, identified by appendiceal orifice and ileocecal                        valve. The colonoscopy was performed without                        difficulty. The patient tolerated the procedure well.                        The quality of the bowel preparation was good. Findings:      The perianal and digital rectal examinations were normal.      Small-mouthed diverticula were found in the descending colon.      There was evidence of a prior end-to-end colo-colonic anastomosis in the       sigmoid colon. This was patent.      Non-bleeding internal hemorrhoids were found during retroflexion. The       hemorrhoids were Grade I (internal hemorrhoids that do not prolapse). Impression:           - Diverticulosis in the descending colon.                       - Patent end-to-end colo-colonic anastomosis.                       - Non-bleeding internal hemorrhoids.                       - No specimens collected. Recommendation:       - Discharge patient to home.                       - Resume previous diet.                       - Continue present medications.                       - Repeat colonoscopy in 10 years for screening unless                        any change in family history or lower GI problems. Procedure Code(s):    --- Professional ---                       [id number], Colonoscopy, flexible; diagnostic, including                        collection of specimen(s) by brushing or washing, when                        performed (separate procedure) Diagnosis Code(s):    --- Professional ---  Z12.11, Encounter for screening for malignant neoplasm                        of colon CPT copyright 2019 [employer]. All rights reserved. The codes documented in this report are preliminary and upon coder review may  be revised to meet current compliance requirements. Lucilla Lame MD, MD 03/15/2019 10:17:05 AM This report has been signed  electronically. Number of Addenda: 0 Note Initiated On: 03/15/2019 9:53 AM Scope Withdrawal Time: 0 hours 7 minutes 54 seconds  Total Procedure Duration: 0 hours 10 minutes 39 seconds  Estimated Blood Loss: Estimated blood loss: none.      [employer]

## 2019-03-15 NOTE — Transfer of Care (Signed)
Immediate Anesthesia Transfer of Care Note  Patient: Tonya Rush  Procedure(s) Performed: COLONOSCOPY WITH PROPOFOL (N/A Rectum)  Patient Location: PACU  Anesthesia Type: General  Level of Consciousness: awake, alert  and patient cooperative  Airway and Oxygen Therapy: Patient Spontanous Breathing and Patient connected to supplemental oxygen  Post-op Assessment: Post-op Vital signs reviewed, Patient's Cardiovascular Status Stable, Respiratory Function Stable, Patent Airway and No signs of Nausea or vomiting  Post-op Vital Signs: Reviewed and stable  Complications: No apparent anesthesia complications

## 2019-03-15 NOTE — Anesthesia Procedure Notes (Signed)
Performed by: Gregroy Dombkowski, CRNA Pre-anesthesia Checklist: Patient identified, Emergency Drugs available, Suction available, Timeout performed and Patient being monitored Patient Re-evaluated:Patient Re-evaluated prior to induction Oxygen Delivery Method: Nasal cannula Placement Confirmation: positive ETCO2       

## 2019-03-16 ENCOUNTER — Encounter: Payer: Self-pay | Admitting: Gastroenterology

## 2019-04-16 ENCOUNTER — Other Ambulatory Visit: Payer: Self-pay | Admitting: Rehabilitation

## 2019-04-16 DIAGNOSIS — M545 Low back pain, unspecified: Secondary | ICD-10-CM

## 2019-04-18 ENCOUNTER — Other Ambulatory Visit: Payer: Self-pay

## 2019-04-18 ENCOUNTER — Ambulatory Visit
Admission: RE | Admit: 2019-04-18 | Discharge: 2019-04-18 | Disposition: A | Discharge status: Home / Self Care | Payer: Medicare Other | Source: Ambulatory Visit | Attending: Rehabilitation | Admitting: Rehabilitation

## 2019-04-18 DIAGNOSIS — M545 Low back pain, unspecified: Secondary | ICD-10-CM

## 2019-04-18 MED ORDER — GADOBENATE DIMEGLUMINE 529 MG/ML IV SOLN
12.0000 mL | Freq: Once | INTRAVENOUS | Status: AC | PRN
  Administered 2019-04-18: 12 mL via INTRAVENOUS

## 2019-05-09 ENCOUNTER — Other Ambulatory Visit: Payer: Medicare Other

## 2021-01-28 ENCOUNTER — Other Ambulatory Visit: Payer: Self-pay | Admitting: Orthopaedic Surgery

## 2021-01-28 DIAGNOSIS — M8589 Other specified disorders of bone density and structure, multiple sites: Secondary | ICD-10-CM

## 2021-02-04 ENCOUNTER — Other Ambulatory Visit: Payer: Self-pay | Admitting: Orthopaedic Surgery
